# Patient Record
Sex: Female | Born: 1960 | Race: White | Hispanic: No | Smoking: Current every day smoker
Health system: Southern US, Community
[De-identification: ages and names within clinical notes are randomized; demographics above are authoritative.]

## PROBLEM LIST (undated history)

## (undated) DIAGNOSIS — E785 Hyperlipidemia, unspecified: Secondary | ICD-10-CM

## (undated) DIAGNOSIS — M722 Plantar fascial fibromatosis: Secondary | ICD-10-CM

## (undated) DIAGNOSIS — E538 Deficiency of other specified B group vitamins: Secondary | ICD-10-CM

## (undated) DIAGNOSIS — K2970 Gastritis, unspecified, without bleeding: Secondary | ICD-10-CM

## (undated) DIAGNOSIS — R3129 Other microscopic hematuria: Secondary | ICD-10-CM

## (undated) DIAGNOSIS — M1990 Unspecified osteoarthritis, unspecified site: Secondary | ICD-10-CM

## (undated) DIAGNOSIS — K2980 Duodenitis without bleeding: Secondary | ICD-10-CM

## (undated) DIAGNOSIS — I739 Peripheral vascular disease, unspecified: Secondary | ICD-10-CM

## (undated) DIAGNOSIS — F419 Anxiety disorder, unspecified: Secondary | ICD-10-CM

## (undated) DIAGNOSIS — F329 Major depressive disorder, single episode, unspecified: Secondary | ICD-10-CM

## (undated) DIAGNOSIS — F32A Depression, unspecified: Secondary | ICD-10-CM

## (undated) DIAGNOSIS — E079 Disorder of thyroid, unspecified: Secondary | ICD-10-CM

## (undated) DIAGNOSIS — K219 Gastro-esophageal reflux disease without esophagitis: Secondary | ICD-10-CM

## (undated) DIAGNOSIS — E039 Hypothyroidism, unspecified: Secondary | ICD-10-CM

## (undated) HISTORY — DX: Anxiety disorder, unspecified: F41.9

## (undated) HISTORY — DX: Major depressive disorder, single episode, unspecified: F32.9

## (undated) HISTORY — DX: Depression, unspecified: F32.A

---

## 2004-05-31 ENCOUNTER — Ambulatory Visit: Payer: Self-pay | Admitting: Internal Medicine

## 2005-05-16 ENCOUNTER — Inpatient Hospital Stay: Payer: Self-pay | Admitting: Internal Medicine

## 2005-05-17 ENCOUNTER — Other Ambulatory Visit: Payer: Self-pay

## 2006-11-28 ENCOUNTER — Ambulatory Visit: Payer: Self-pay | Admitting: Internal Medicine

## 2006-12-31 ENCOUNTER — Ambulatory Visit: Payer: Self-pay | Admitting: Internal Medicine

## 2007-01-07 ENCOUNTER — Ambulatory Visit: Payer: Self-pay | Admitting: Internal Medicine

## 2007-10-31 ENCOUNTER — Ambulatory Visit: Payer: Self-pay | Admitting: Internal Medicine

## 2011-01-22 ENCOUNTER — Emergency Department: Payer: Self-pay | Admitting: Emergency Medicine

## 2012-07-17 HISTORY — PX: COLONOSCOPY: SHX174

## 2012-07-18 ENCOUNTER — Ambulatory Visit: Payer: Self-pay | Admitting: Internal Medicine

## 2013-02-17 ENCOUNTER — Ambulatory Visit: Payer: Self-pay | Admitting: Unknown Physician Specialty

## 2015-08-12 ENCOUNTER — Other Ambulatory Visit: Payer: Self-pay | Admitting: Internal Medicine

## 2015-08-12 DIAGNOSIS — Z6841 Body Mass Index (BMI) 40.0 and over, adult: Secondary | ICD-10-CM | POA: Insufficient documentation

## 2015-08-12 DIAGNOSIS — R200 Anesthesia of skin: Secondary | ICD-10-CM

## 2015-08-12 DIAGNOSIS — N3944 Nocturnal enuresis: Secondary | ICD-10-CM

## 2015-08-24 ENCOUNTER — Ambulatory Visit: Admission: RE | Admit: 2015-08-24 | Payer: Self-pay | Source: Ambulatory Visit

## 2015-09-08 ENCOUNTER — Ambulatory Visit: Payer: BLUE CROSS/BLUE SHIELD

## 2016-10-04 ENCOUNTER — Other Ambulatory Visit: Payer: Self-pay | Admitting: Internal Medicine

## 2016-10-04 DIAGNOSIS — Z1231 Encounter for screening mammogram for malignant neoplasm of breast: Secondary | ICD-10-CM

## 2016-10-10 ENCOUNTER — Ambulatory Visit: Payer: BLUE CROSS/BLUE SHIELD

## 2016-10-23 ENCOUNTER — Ambulatory Visit: Payer: Self-pay | Attending: Oncology

## 2016-10-23 ENCOUNTER — Ambulatory Visit: Payer: BLUE CROSS/BLUE SHIELD

## 2017-09-20 ENCOUNTER — Encounter: Payer: Self-pay | Admitting: Emergency Medicine

## 2017-09-20 ENCOUNTER — Emergency Department: Payer: Medicaid Other

## 2017-09-20 ENCOUNTER — Emergency Department
Admission: EM | Admit: 2017-09-20 | Discharge: 2017-09-20 | Disposition: A | Discharge status: Home / Self Care | Payer: Medicaid Other | Attending: Emergency Medicine | Admitting: Emergency Medicine

## 2017-09-20 DIAGNOSIS — Y9389 Activity, other specified: Secondary | ICD-10-CM | POA: Diagnosis not present

## 2017-09-20 DIAGNOSIS — Y929 Unspecified place or not applicable: Secondary | ICD-10-CM | POA: Insufficient documentation

## 2017-09-20 DIAGNOSIS — Y999 Unspecified external cause status: Secondary | ICD-10-CM | POA: Insufficient documentation

## 2017-09-20 DIAGNOSIS — S61221A Laceration with foreign body of left index finger without damage to nail, initial encounter: Secondary | ICD-10-CM | POA: Insufficient documentation

## 2017-09-20 DIAGNOSIS — Z23 Encounter for immunization: Secondary | ICD-10-CM | POA: Insufficient documentation

## 2017-09-20 DIAGNOSIS — W268XXA Contact with other sharp object(s), not elsewhere classified, initial encounter: Secondary | ICD-10-CM | POA: Insufficient documentation

## 2017-09-20 DIAGNOSIS — S61211A Laceration without foreign body of left index finger without damage to nail, initial encounter: Secondary | ICD-10-CM

## 2017-09-20 MED ORDER — HYDROCODONE-ACETAMINOPHEN 5-325 MG PO TABS
1.0000 | ORAL_TABLET | Freq: Once | ORAL | Status: AC
  Administered 2017-09-20: 1 via ORAL
  Filled 2017-09-20: qty 1

## 2017-09-20 MED ORDER — LIDOCAINE HCL 1 % IJ SOLN
5.0000 mL | Freq: Once | INTRAMUSCULAR | Status: DC
  Filled 2017-09-20: qty 5

## 2017-09-20 MED ORDER — CEPHALEXIN 500 MG PO CAPS: 500.0000 mg | ORAL_CAPSULE | Freq: Three times a day (TID) | ORAL | 0 refills | Status: AC

## 2017-09-20 MED ORDER — LIDOCAINE HCL (PF) 1 % IJ SOLN
INTRAMUSCULAR | Status: AC
  Filled 2017-09-20: qty 5

## 2017-09-20 MED ORDER — HYDROCODONE-ACETAMINOPHEN 5-325 MG PO TABS
1.0000 | ORAL_TABLET | Freq: Four times a day (QID) | ORAL | 0 refills | Status: AC | PRN
Start: 2017-09-20 — End: 2017-09-23

## 2017-09-20 MED ORDER — TETANUS-DIPHTH-ACELL PERTUSSIS 5-2.5-18.5 LF-MCG/0.5 IM SUSP
0.5000 mL | Freq: Once | INTRAMUSCULAR | Status: AC
  Administered 2017-09-20: 0.5 mL via INTRAMUSCULAR
  Filled 2017-09-20: qty 0.5

## 2017-09-20 NOTE — ED Notes (Signed)
X-ray at bedside

## 2017-09-20 NOTE — ED Provider Notes (Signed)
[facility] Emergency Department Provider Note  ____________________________________________  Time seen: Approximately 10:37 PM  I have reviewed the triage vital signs and the nursing notes.   HISTORY  Chief Complaint Laceration    HPI Tamara Brady is a 57 y.o. female presents to the emergency department with a 2 cm semilunar left index finger laceration sustained as patient was trying to change her salt and pepper shakers.  Patient denies weakness, radiculopathy or changes in sensation of the upper extremities.  History reviewed. No pertinent past medical history.  There are no active problems to display for this patient.   History reviewed. No pertinent surgical history.  Prior to Admission medications   Medication Sig Start Date End Date Taking? Authorizing Provider  cephALEXin (KEFLEX) 500 MG capsule Take 1 capsule (500 mg total) by mouth 3 (three) times daily for 10 days. 09/20/17 09/30/17  Lannie Fields, PA-C  HYDROcodone-acetaminophen (NORCO) 5-325 MG tablet Take 1 tablet by mouth every 6 (six) hours as needed for up to 3 days for moderate pain. 09/20/17 09/23/17  Lannie Fields, PA-C    Allergies Patient has no known allergies.  No family history on file.  Social History Social History   Tobacco Use  . Smoking status: Never Smoker  . Smokeless tobacco: Never Used  Substance Use Topics  . Alcohol use: No    Frequency: Never  . Drug use: No     Review of Systems  Constitutional: No fever/chills Eyes: No visual changes. No discharge ENT: No upper respiratory complaints. Cardiovascular: no chest pain. Respiratory: no cough. No SOB. Gastrointestinal: No abdominal pain.  No nausea, no vomiting.  No diarrhea.  No constipation. Musculoskeletal: Negative for musculoskeletal pain. Skin: Patient has laceration. Neurological: Negative for headaches, focal weakness or  numbness.  ____________________________________________   PHYSICAL EXAM:  VITAL SIGNS: ED Triage Vitals  Enc Vitals Group     BP 09/20/17 2004 (!) 157/95     Pulse Rate 09/20/17 2004 80     Resp 09/20/17 2004 20     Temp 09/20/17 2004 98.1 F (36.7 C)     Temp Source 09/20/17 2004 Oral     SpO2 09/20/17 2004 97 %     Weight 09/20/17 2002 300 lb (136.1 kg)     Height 09/20/17 2002 5\' 5"  (1.651 m)     Head Circumference --      Peak Flow --      Pain Score 09/20/17 2002 7     Pain Loc --      Pain Edu? --      Excl. in [place]? --      Constitutional: Alert and oriented. Well appearing and in no acute distress. Eyes: Conjunctivae are normal. PERRL. EOMI. Head: Atraumatic. Cardiovascular: Normal rate, regular rhythm. Normal S1 and S2.  Good peripheral circulation. Respiratory: Normal respiratory effort without tachypnea or retractions. Lungs CTAB. Good air entry to the bases with no decreased or absent breath sounds. Musculoskeletal: Full range of motion to all extremities. No gross deformities appreciated.  Patient has 2 cm semilunar left index finger laceration along the dorsal aspect of the proximal digit.  Palpable radial pulse, left. Neurologic:  Normal speech and language. No gross focal neurologic deficits are appreciated.  Skin: Patient has 2 cm, semilunar left index finger laceration. Psychiatric: Mood and affect are normal. Speech and behavior are normal. Patient exhibits appropriate insight and judgement.   ____________________________________________   LABS (all labs ordered are listed, but only abnormal  results are displayed)  Labs Reviewed - No data to display ____________________________________________  EKG   ____________________________________________  RADIOLOGY Unk Pinto, personally viewed and evaluated these images (plain radiographs) as part of my medical decision making, as well as reviewing the written report by the radiologist.  Dg Hand  Complete Left  Result Date: 09/20/2017 CLINICAL DATA:  Laceration to the index finger. EXAM: LEFT HAND - COMPLETE 3+ VIEW COMPARISON:  None. FINDINGS: Normal anatomic alignment. No evidence for acute fracture or dislocation. Regional soft tissues are unremarkable. IMPRESSION: No acute osseous abnormality. Electronically Signed   By: Lovey Newcomer M.D.   On: 09/20/2017 21:12    ____________________________________________    PROCEDURES  Procedure(s) performed:    Procedures  LACERATION REPAIR Performed by: Lannie Fields Authorized by: Lannie Fields Consent: Verbal consent obtained. Risks and benefits: risks, benefits and alternatives were discussed Consent given by: patient Patient identity confirmed: provided demographic data Prepped and Draped in normal sterile fashion Wound explored  Laceration Location: Left index finger  Laceration Length: 2 cm   No Foreign Bodies seen or palpated  Anesthesia: local infiltration  Local anesthetic: lidocaine 1% without epinephrine  Anesthetic total: 5 ml  Irrigation method: syringe Amount of cleaning: standard  Skin closure: 4-0 Ethilon Number of sutures: 6  Technique: Simple interrupted  Patient tolerance: Patient tolerated the procedure well with no immediate complications.   Medications  lidocaine (XYLOCAINE) 1 % (with pres) injection 5 mL (not administered)  Tdap (BOOSTRIX) injection 0.5 mL (0.5 mLs Intramuscular Given 09/20/17 2142)  HYDROcodone-acetaminophen (NORCO/VICODIN) 5-325 MG per tablet 1 tablet (1 tablet Oral Given 09/20/17 2141)     ____________________________________________   INITIAL IMPRESSION / ASSESSMENT AND PLAN / ED COURSE  Pertinent labs & imaging results that were available during my care of the patient were reviewed by me and considered in my medical decision making (see chart for details).  Review of the [place] CSRS was performed in accordance of the [place] prior to dispensing any controlled drugs.      Assessment and plan Left index finger laceration Patient presents to the emergency department with a 2 cm semilunar laceration that was repaired in the emergency department without complication.  Patient was advised to have sutures removed by primary care in 9 days.  Patient was discharged with Keflex and her tetanus status was updated in the emergency department.  She was given Norco for pain and she was discharged with a short course of Norco.  All patient questions were answered.     ____________________________________________  FINAL CLINICAL IMPRESSION(S) / ED DIAGNOSES  Final diagnoses:  Laceration of left index finger without foreign body without damage to nail, initial encounter      NEW MEDICATIONS STARTED DURING THIS VISIT:  ED Discharge Orders        Ordered    cephALEXin (KEFLEX) 500 MG capsule  3 times daily     09/20/17 2135    HYDROcodone-acetaminophen (NORCO) 5-325 MG tablet  Every 6 hours PRN     09/20/17 2136          This chart was dictated using voice recognition software/Dragon. Despite best efforts to proofread, errors can occur which can change the meaning. Any change was purely unintentional.    Lannie Fields, PA-C 09/20/17 2241    Harvest Dark, MD 09/20/17 2242

## 2017-09-20 NOTE — ED Triage Notes (Signed)
Pt comes into the ED via POV c/o laceration to the left hand index finger.  Patient states she cut the finger on a knife.  Bleeding under control at this time and patient is in NAD.

## 2017-09-29 ENCOUNTER — Emergency Department
Admission: EM | Admit: 2017-09-29 | Discharge: 2017-09-29 | Disposition: A | Discharge status: Home / Self Care | Payer: Medicaid Other | Attending: Emergency Medicine | Admitting: Emergency Medicine

## 2017-09-29 ENCOUNTER — Other Ambulatory Visit: Payer: Self-pay

## 2017-09-29 ENCOUNTER — Encounter: Payer: Self-pay | Admitting: Emergency Medicine

## 2017-09-29 DIAGNOSIS — S61211D Laceration without foreign body of left index finger without damage to nail, subsequent encounter: Secondary | ICD-10-CM | POA: Insufficient documentation

## 2017-09-29 DIAGNOSIS — X58XXXA Exposure to other specified factors, initial encounter: Secondary | ICD-10-CM | POA: Diagnosis not present

## 2017-09-29 DIAGNOSIS — Z4802 Encounter for removal of sutures: Secondary | ICD-10-CM

## 2017-09-29 DIAGNOSIS — F17210 Nicotine dependence, cigarettes, uncomplicated: Secondary | ICD-10-CM | POA: Insufficient documentation

## 2017-09-29 DIAGNOSIS — S66902D Unspecified injury of unspecified muscle, fascia and tendon at wrist and hand level, left hand, subsequent encounter: Secondary | ICD-10-CM

## 2017-09-29 HISTORY — DX: Disorder of thyroid, unspecified: E07.9

## 2017-09-29 MED ORDER — FLUCONAZOLE 100 MG PO TABS: 150.0000 mg | ORAL_TABLET | Freq: Every day | ORAL | 0 refills | Status: AC

## 2017-09-29 NOTE — ED Triage Notes (Signed)
Here for suture removal L index finger, placed 9 days ago.

## 2017-09-29 NOTE — ED Notes (Signed)
Pt is here for suture removal

## 2017-09-29 NOTE — ED Provider Notes (Signed)
[facility] Emergency Department Provider Note  ____________________________________________  Time seen: Approximately 11:28 AM  I have reviewed the triage vital signs and the nursing notes.   HISTORY  Chief Complaint Suture / Staple Removal    HPI Tamara Brady is a 57 y.o. female that presents to the emergency department for suture removal.  She is having some difficulty bending finger without assistance from other hand.  Patient has been taking antibiotics as prescribed but states she forgot to ask for a prophylactic Diflucan for yeast infection when she was here last.  She usually gets a yeast infection with antibiotics. No drainage, numbness, tingling.   Past Medical History:  Diagnosis Date  . Thyroid disease     There are no active problems to display for this patient.   History reviewed. No pertinent surgical history.  Prior to Admission medications   Medication Sig Start Date End Date Taking? Authorizing Provider  cephALEXin (KEFLEX) 500 MG capsule Take 1 capsule (500 mg total) by mouth 3 (three) times daily for 10 days. 09/20/17 09/30/17  Lannie Fields, PA-C  fluconazole (DIFLUCAN) 100 MG tablet Take 1.5 tablets (150 mg total) by mouth daily. 09/29/17 10/29/17  Laban Emperor, PA-C    Allergies Patient has no known allergies.  No family history on file.  Social History Social History   Tobacco Use  . Smoking status: Current Every Day Smoker    Packs/day: 0.50    Types: Cigarettes  . Smokeless tobacco: Never Used  Substance Use Topics  . Alcohol use: No    Frequency: Never  . Drug use: No     Review of Systems  Constitutional: No fever/chills Musculoskeletal: Negative for musculoskeletal pain. Skin: Negative for rash, ecchymosis. Neurological: Negative for numbness or tingling   ____________________________________________   PHYSICAL EXAM:  VITAL SIGNS: ED Triage Vitals  Enc Vitals Group     BP 09/29/17 0929  133/90     Pulse Rate 09/29/17 0929 95     Resp 09/29/17 0929 20     Temp 09/29/17 0929 98.1 F (36.7 C)     Temp Source 09/29/17 0929 Oral     SpO2 09/29/17 0929 96 %     Weight 09/29/17 0930 300 lb (136.1 kg)     Height 09/29/17 0930 5\' 5"  (1.651 m)     Head Circumference --      Peak Flow --      Pain Score 09/29/17 0929 0     Pain Loc --      Pain Edu? --      Excl. in [place]? --      Constitutional: Alert and oriented. Well appearing and in no acute distress. Eyes: Conjunctivae are normal. PERRL. EOMI. Head: Atraumatic. ENT:      Ears:      Nose: No congestion/rhinnorhea.      Mouth/Throat: Mucous membranes are moist.  Neck: No stridor.   Cardiovascular:  Good peripheral circulation. Respiratory: Normal respiratory effort without tachypnea or retractions.  Musculoskeletal: Full range of motion to all extremities. No gross deformities appreciated.  Difficulty actively flexing left index finger at PIP joint.  No pain with passive flexion at PIP joint of left index finger.  Neurologic:  Normal speech and language. No gross focal neurologic deficits are appreciated.  Skin:  Skin is warm, dry. 2cm semilunar well healing laceration to left index finger with 9 sutures in place. No drainage. No surrounding erythema.    ____________________________________________   LABS (all labs  ordered are listed, but only abnormal results are displayed)  Labs Reviewed - No data to display ____________________________________________  EKG   ____________________________________________  RADIOLOGY   No results found.  ____________________________________________    PROCEDURES  Procedure(s) performed:    Procedures  SUTURE REMOVAL Performed by: Laban Emperor  Consent: Verbal consent obtained. Patient identity confirmed: provided demographic data Time out: Immediately prior to procedure a "time out" was called to verify the correct patient, procedure, equipment, support  staff and site/side marked as required.  Location details: finger  Wound Appearance: clean  Sutures/Staples Removed: 9  Facility: sutures placed in this facility Patient tolerance: Patient tolerated the procedure well with no immediate complications.    Medications - No data to display   ____________________________________________   INITIAL IMPRESSION / ASSESSMENT AND PLAN / ED COURSE  Pertinent labs & imaging results that were available during my care of the patient were reviewed by me and considered in my medical decision making (see chart for details).  Review of the [place] CSRS was performed in accordance of the [place] prior to dispensing any controlled drugs.   Patient presented to the emergency department for suture removal.  Vital signs and exam are reassuring.  Sutures were removed in ED.  Steri-Strips were placed.  Splint was placed.  Patient is having difficulty actively bending finger at PIP joint of left index finger so she will be referred to orthopedics.  She is requesting a prescription for Diflucan for frequent yeast infections that she gets with antibiotics.  Patient will be discharged home with prescriptions for diflucan. Patient is to follow up with orthopedics as directed. Patient is given ED precautions to return to the ED for any worsening or new symptoms.     ____________________________________________  FINAL CLINICAL IMPRESSION(S) / ED DIAGNOSES  Final diagnoses:  Injury of extensor tendon of left hand, subsequent encounter  Visit for suture removal      NEW MEDICATIONS STARTED DURING THIS VISIT:  ED Discharge Orders        Ordered    fluconazole (DIFLUCAN) 100 MG tablet  Daily     09/29/17 1034          This chart was dictated using voice recognition software/Dragon. Despite best efforts to proofread, errors can occur which can change the meaning. Any change was purely unintentional.    Laban Emperor, PA-C 09/29/17 1538    Lavonia Drafts, MD 09/30/17 [phone]

## 2017-12-17 ENCOUNTER — Other Ambulatory Visit
Admission: RE | Admit: 2017-12-17 | Discharge: 2017-12-17 | Disposition: A | Discharge status: Home / Self Care | Payer: Medicaid Other | Source: Ambulatory Visit | Attending: Internal Medicine | Admitting: Internal Medicine

## 2017-12-17 DIAGNOSIS — R0609 Other forms of dyspnea: Secondary | ICD-10-CM | POA: Diagnosis not present

## 2017-12-17 DIAGNOSIS — E034 Atrophy of thyroid (acquired): Secondary | ICD-10-CM | POA: Insufficient documentation

## 2017-12-17 LAB — FIBRIN DERIVATIVES D-DIMER (ARMC ONLY): FIBRIN DERIVATIVES D-DIMER (ARMC): 418.61 ng{FEU}/mL (ref 0.00–499.00)

## 2018-04-05 ENCOUNTER — Other Ambulatory Visit: Payer: Self-pay | Admitting: Orthopedic Surgery

## 2018-04-05 DIAGNOSIS — M5442 Lumbago with sciatica, left side: Secondary | ICD-10-CM

## 2018-04-08 ENCOUNTER — Ambulatory Visit (INDEPENDENT_AMBULATORY_CARE_PROVIDER_SITE_OTHER): Payer: Medicaid Other | Admitting: Psychiatry

## 2018-04-08 ENCOUNTER — Encounter: Payer: Self-pay | Admitting: Psychiatry

## 2018-04-08 ENCOUNTER — Other Ambulatory Visit: Payer: Self-pay

## 2018-04-08 VITALS — BP 126/72 | HR 85 | Temp 97.6°F | Wt 293.6 lb

## 2018-04-08 DIAGNOSIS — E538 Deficiency of other specified B group vitamins: Secondary | ICD-10-CM | POA: Insufficient documentation

## 2018-04-08 DIAGNOSIS — Z634 Disappearance and death of family member: Secondary | ICD-10-CM

## 2018-04-08 DIAGNOSIS — F411 Generalized anxiety disorder: Secondary | ICD-10-CM | POA: Diagnosis not present

## 2018-04-08 DIAGNOSIS — F331 Major depressive disorder, recurrent, moderate: Secondary | ICD-10-CM

## 2018-04-08 DIAGNOSIS — F32A Depression, unspecified: Secondary | ICD-10-CM | POA: Insufficient documentation

## 2018-04-08 DIAGNOSIS — E039 Hypothyroidism, unspecified: Secondary | ICD-10-CM | POA: Insufficient documentation

## 2018-04-08 DIAGNOSIS — F5105 Insomnia due to other mental disorder: Secondary | ICD-10-CM | POA: Diagnosis not present

## 2018-04-08 DIAGNOSIS — F329 Major depressive disorder, single episode, unspecified: Secondary | ICD-10-CM | POA: Insufficient documentation

## 2018-04-08 DIAGNOSIS — I739 Peripheral vascular disease, unspecified: Secondary | ICD-10-CM | POA: Insufficient documentation

## 2018-04-08 DIAGNOSIS — E785 Hyperlipidemia, unspecified: Secondary | ICD-10-CM | POA: Insufficient documentation

## 2018-04-08 DIAGNOSIS — R3129 Other microscopic hematuria: Secondary | ICD-10-CM | POA: Insufficient documentation

## 2018-04-08 DIAGNOSIS — K219 Gastro-esophageal reflux disease without esophagitis: Secondary | ICD-10-CM | POA: Insufficient documentation

## 2018-04-08 DIAGNOSIS — M1990 Unspecified osteoarthritis, unspecified site: Secondary | ICD-10-CM | POA: Insufficient documentation

## 2018-04-08 MED ORDER — QUETIAPINE FUMARATE 50 MG PO TABS: 25.0000 mg | ORAL_TABLET | Freq: Every day | ORAL | 0 refills | Status: DC

## 2018-04-08 MED ORDER — VENLAFAXINE HCL ER 37.5 MG PO CP24: 37.5000 mg | ORAL_CAPSULE | Freq: Every day | ORAL | 0 refills | Status: DC

## 2018-04-08 MED ORDER — TRAZODONE HCL 50 MG PO TABS: 25.0000 mg | ORAL_TABLET | Freq: Every day | ORAL | 0 refills | Status: DC

## 2018-04-08 NOTE — Patient Instructions (Signed)
Trazodone tablets What is this medicine? TRAZODONE (TRAZ oh done) is used to treat depression. This medicine may be used for other purposes; ask your health care provider or pharmacist if you have questions. COMMON BRAND NAME(S): Desyrel What should I tell my health care provider before I take this medicine? They need to know if you have any of these conditions: -attempted suicide or thinking about it -bipolar disorder -bleeding problems -glaucoma -heart disease, or previous heart attack -irregular heart beat -kidney or liver disease -low levels of sodium in the blood -an unusual or allergic reaction to trazodone, other medicines, foods, dyes or preservatives -pregnant or trying to get pregnant -breast-feeding How should I use this medicine? Take this medicine by mouth with a glass of water. Follow the directions on the prescription label. Take this medicine shortly after a meal or a light snack. Take your medicine at regular intervals. Do not take your medicine more often than directed. Do not stop taking this medicine suddenly except upon the advice of your doctor. Stopping this medicine too quickly may cause serious side effects or your condition may worsen. A special MedGuide will be given to you by the pharmacist with each prescription and refill. Be sure to read this information carefully each time. Talk to your pediatrician regarding the use of this medicine in children. Special care may be needed. Overdosage: If you think you have taken too much of this medicine contact a poison control center or emergency room at once. NOTE: This medicine is only for you. Do not share this medicine with others. What if I miss a dose? If you miss a dose, take it as soon as you can. If it is almost time for your next dose, take only that dose. Do not take double or extra doses. What may interact with this medicine? Do not take this medicine with any of the following medications: -certain medicines  for fungal infections like fluconazole, itraconazole, ketoconazole, posaconazole, voriconazole -cisapride -dofetilide -dronedarone -linezolid -MAOIs like Carbex, Eldepryl, Marplan, Nardil, and Parnate -mesoridazine -methylene blue (injected into a vein) -pimozide -saquinavir -thioridazine -ziprasidone This medicine may also interact with the following medications: -alcohol -antiviral medicines for HIV or AIDS -aspirin and aspirin-like medicines -barbiturates like phenobarbital -certain medicines for blood pressure, heart disease, irregular heart beat -certain medicines for depression, anxiety, or psychotic disturbances -certain medicines for migraine headache like almotriptan, eletriptan, frovatriptan, naratriptan, rizatriptan, sumatriptan, zolmitriptan -certain medicines for seizures like carbamazepine and phenytoin -certain medicines for sleep -certain medicines that treat or prevent blood clots like dalteparin, enoxaparin, warfarin -digoxin -fentanyl -lithium -NSAIDS, medicines for pain and inflammation, like ibuprofen or naproxen -other medicines that prolong the QT interval (cause an abnormal heart rhythm) -rasagiline -supplements like St. John's wort, kava kava, valerian -tramadol -tryptophan This list may not describe all possible interactions. Give your health care provider a list of all the medicines, herbs, non-prescription drugs, or dietary supplements you use. Also tell them if you smoke, drink alcohol, or use illegal drugs. Some items may interact with your medicine. What should I watch for while using this medicine? Tell your doctor if your symptoms do not get better or if they get worse. Visit your doctor or health care professional for regular checks on your progress. Because it may take several weeks to see the full effects of this medicine, it is important to continue your treatment as prescribed by your doctor. Patients and their families should watch out for new  or worsening thoughts of suicide or depression. Also   watch out for sudden changes in feelings such as feeling anxious, agitated, panicky, irritable, hostile, aggressive, impulsive, severely restless, overly excited and hyperactive, or not being able to sleep. If this happens, especially at the beginning of treatment or after a change in dose, call your health care professional. Dennis Bast may get drowsy or dizzy. Do not drive, use machinery, or do anything that needs mental alertness until you know how this medicine affects you. Do not stand or sit up quickly, especially if you are an older patient. This reduces the risk of dizzy or fainting spells. Alcohol may interfere with the effect of this medicine. Avoid alcoholic drinks. This medicine may cause dry eyes and blurred vision. If you wear contact lenses you may feel some discomfort. Lubricating drops may help. See your eye doctor if the problem does not go away or is severe. Your mouth may get dry. Chewing sugarless gum, sucking hard candy and drinking plenty of water may help. Contact your doctor if the problem does not go away or is severe. What side effects may I notice from receiving this medicine? Side effects that you should report to your doctor or health care professional as soon as possible: -allergic reactions like skin rash, itching or hives, swelling of the face, lips, or tongue -elevated mood, decreased need for sleep, racing thoughts, impulsive behavior -confusion -fast, irregular heartbeat -feeling faint or lightheaded, falls -feeling agitated, angry, or irritable -loss of balance or coordination -painful or prolonged erections -restlessness, pacing, inability to keep still -suicidal thoughts or other mood changes -tremors -trouble sleeping -seizures -unusual bleeding or bruising Side effects that usually do not require medical attention (report to your doctor or health care professional if they continue or are bothersome): -change in  sex drive or performance -change in appetite or weight -constipation -headache -muscle aches or pains -nausea This list may not describe all possible side effects. Call your doctor for medical advice about side effects. You may report side effects to FDA at 1-800-FDA-1088. Where should I keep my medicine? Keep out of the reach of children. Store at room temperature between 15 and 30 degrees C (59 to 86 degrees F). Protect from light. Keep container tightly closed. Throw away any unused medicine after the expiration date. NOTE: This sheet is a summary. It may not cover all possible information. If you have questions about this medicine, talk to your doctor, pharmacist, or health care provider.  2018 [employer] (2015-12-02 16:57:05) Venlafaxine extended-release capsules What is this medicine? VENLAFAXINE(VEN la fax een) is used to treat depression, anxiety and panic disorder. This medicine may be used for other purposes; ask your health care provider or pharmacist if you have questions. COMMON BRAND NAME(S): Effexor XR What should I tell my health care provider before I take this medicine? They need to know if you have any of these conditions: -bleeding disorders -glaucoma -heart disease -high blood pressure -high cholesterol -kidney disease -liver disease -low levels of sodium in the blood -mania or bipolar disorder -seizures -suicidal thoughts, plans, or attempt; a previous suicide attempt by you or a family -take medicines that treat or prevent blood clots -thyroid disease -an unusual or allergic reaction to venlafaxine, desvenlafaxine, other medicines, foods, dyes, or preservatives -pregnant or trying to get pregnant -breast-feeding How should I use this medicine? Take this medicine by mouth with a full glass of water. Follow the directions on the prescription label. Do not cut, crush, or chew this medicine. Take it with food. If needed, the capsule  may be carefully  opened and the entire contents sprinkled on a spoonful of cool applesauce. Swallow the applesauce/pellet mixture right away without chewing and follow with a glass of water to ensure complete swallowing of the pellets. Try to take your medicine at about the same time each day. Do not take your medicine more often than directed. Do not stop taking this medicine suddenly except upon the advice of your doctor. Stopping this medicine too quickly may cause serious side effects or your condition may worsen. A special MedGuide will be given to you by the pharmacist with each prescription and refill. Be sure to read this information carefully each time. Talk to your pediatrician regarding the use of this medicine in children. Special care may be needed. Overdosage: If you think you have taken too much of this medicine contact a poison control center or emergency room at once. NOTE: This medicine is only for you. Do not share this medicine with others. What if I miss a dose? If you miss a dose, take it as soon as you can. If it is almost time for your next dose, take only that dose. Do not take double or extra doses. What may interact with this medicine? Do not take this medicine with any of the following medications: -certain medicines for fungal infections like fluconazole, itraconazole, ketoconazole, posaconazole, voriconazole -cisapride -desvenlafaxine -dofetilide -dronedarone -duloxetine -levomilnacipran -linezolid -MAOIs like Carbex, Eldepryl, Marplan, Nardil, and Parnate -methylene blue (injected into a vein) -milnacipran -pimozide -thioridazine -ziprasidone This medicine may also interact with the following medications: -amphetamines -aspirin and aspirin-like medicines -certain medicines for depression, anxiety, or psychotic disturbances -certain medicines for migraine headaches like almotriptan, eletriptan, frovatriptan, naratriptan, rizatriptan, sumatriptan, zolmitriptan -certain  medicines for sleep -certain medicines that treat or prevent blood clots like dalteparin, enoxaparin, warfarin -cimetidine -clozapine -diuretics -fentanyl -furazolidone -indinavir -isoniazid -lithium -metoprolol -NSAIDS, medicines for pain and inflammation, like ibuprofen or naproxen -other medicines that prolong the QT interval (cause an abnormal heart rhythm) -procarbazine -rasagiline -supplements like St. John's wort, kava kava, valerian -tramadol -tryptophan This list may not describe all possible interactions. Give your health care provider a list of all the medicines, herbs, non-prescription drugs, or dietary supplements you use. Also tell them if you smoke, drink alcohol, or use illegal drugs. Some items may interact with your medicine. What should I watch for while using this medicine? Tell your doctor if your symptoms do not get better or if they get worse. Visit your doctor or health care professional for regular checks on your progress. Because it may take several weeks to see the full effects of this medicine, it is important to continue your treatment as prescribed by your doctor. Patients and their families should watch out for new or worsening thoughts of suicide or depression. Also watch out for sudden changes in feelings such as feeling anxious, agitated, panicky, irritable, hostile, aggressive, impulsive, severely restless, overly excited and hyperactive, or not being able to sleep. If this happens, especially at the beginning of treatment or after a change in dose, call your health care professional. This medicine can cause an increase in blood pressure. Check with your doctor for instructions on monitoring your blood pressure while taking this medicine. You may get drowsy or dizzy. Do not drive, use machinery, or do anything that needs mental alertness until you know how this medicine affects you. Do not stand or sit up quickly, especially if you are an older patient. This  reduces the risk of dizzy or fainting  spells. Alcohol may interfere with the effect of this medicine. Avoid alcoholic drinks. Your mouth may get dry. Chewing sugarless gum, sucking hard candy and drinking plenty of water will help. Contact your doctor if the problem does not go away or is severe. What side effects may I notice from receiving this medicine? Side effects that you should report to your doctor or health care professional as soon as possible: -allergic reactions like skin rash, itching or hives, swelling of the face, lips, or tongue -anxious -breathing problems -confusion -changes in vision -chest pain -confusion -elevated mood, decreased need for sleep, racing thoughts, impulsive behavior -eye pain -fast, irregular heartbeat -feeling faint or lightheaded, falls -feeling agitated, angry, or irritable -hallucination, loss of contact with reality -high blood pressure -loss of balance or coordination -palpitations -redness, blistering, peeling or loosening of the skin, including inside the mouth -restlessness, pacing, inability to keep still -seizures -stiff muscles -suicidal thoughts or other mood changes -trouble passing urine or change in the amount of urine -trouble sleeping -unusual bleeding or bruising -unusually weak or tired -vomiting Side effects that usually do not require medical attention (report to your doctor or health care professional if they continue or are bothersome): -change in sex drive or performance -change in appetite or weight -constipation -dizziness -dry mouth -headache -increased sweating -nausea -tired This list may not describe all possible side effects. Call your doctor for medical advice about side effects. You may report side effects to FDA at 1-800-FDA-1088. Where should I keep my medicine? Keep out of the reach of children. Store at a controlled temperature between 20 and 25 degrees C (68 degrees and 77 degrees F), in a dry place.  Throw away any unused medicine after the expiration date. NOTE: This sheet is a summary. It may not cover all possible information. If you have questions about this medicine, talk to your doctor, pharmacist, or health care provider.  2018 [employer] Standard (2015-12-02 18:38:02)
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
## 2018-04-10 ENCOUNTER — Ambulatory Visit: Payer: Medicaid Other | Attending: Internal Medicine

## 2018-04-10 DIAGNOSIS — G4761 Periodic limb movement disorder: Secondary | ICD-10-CM | POA: Diagnosis not present

## 2018-04-10 DIAGNOSIS — G4733 Obstructive sleep apnea (adult) (pediatric): Secondary | ICD-10-CM | POA: Diagnosis present

## 2018-04-16 ENCOUNTER — Ambulatory Visit (INDEPENDENT_AMBULATORY_CARE_PROVIDER_SITE_OTHER): Payer: Medicaid Other | Admitting: Licensed Clinical Social Worker

## 2018-04-16 DIAGNOSIS — F331 Major depressive disorder, recurrent, moderate: Secondary | ICD-10-CM

## 2018-04-16 DIAGNOSIS — F411 Generalized anxiety disorder: Secondary | ICD-10-CM
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
## 2018-04-18 DIAGNOSIS — G4733 Obstructive sleep apnea (adult) (pediatric): Secondary | ICD-10-CM | POA: Insufficient documentation

## 2018-04-19 ENCOUNTER — Ambulatory Visit
Admission: RE | Admit: 2018-04-19 | Discharge: 2018-04-19 | Disposition: A | Discharge status: Home / Self Care | Payer: Medicaid Other | Source: Ambulatory Visit | Attending: Orthopedic Surgery | Admitting: Orthopedic Surgery

## 2018-04-19 DIAGNOSIS — M5136 Other intervertebral disc degeneration, lumbar region: Secondary | ICD-10-CM | POA: Insufficient documentation

## 2018-04-19 DIAGNOSIS — M5442 Lumbago with sciatica, left side: Secondary | ICD-10-CM

## 2018-04-22 ENCOUNTER — Ambulatory Visit (INDEPENDENT_AMBULATORY_CARE_PROVIDER_SITE_OTHER): Payer: Medicaid Other | Admitting: Psychiatry

## 2018-04-22 ENCOUNTER — Other Ambulatory Visit: Payer: Self-pay

## 2018-04-22 ENCOUNTER — Encounter: Payer: Self-pay | Admitting: Psychiatry

## 2018-04-22 VITALS — BP 110/60 | Temp 98.2°F | Ht 65.0 in | Wt 292.8 lb

## 2018-04-22 DIAGNOSIS — F331 Major depressive disorder, recurrent, moderate: Secondary | ICD-10-CM

## 2018-04-22 DIAGNOSIS — F5105 Insomnia due to other mental disorder: Secondary | ICD-10-CM

## 2018-04-22 DIAGNOSIS — Z634 Disappearance and death of family member: Secondary | ICD-10-CM | POA: Diagnosis not present

## 2018-04-22 DIAGNOSIS — F411 Generalized anxiety disorder: Secondary | ICD-10-CM | POA: Diagnosis not present

## 2018-04-22 MED ORDER — QUETIAPINE FUMARATE 25 MG PO TABS: 75.0000 mg | ORAL_TABLET | Freq: Every day | ORAL | 1 refills | Status: DC

## 2018-04-22 MED ORDER — VENLAFAXINE HCL ER 75 MG PO CP24: 75.0000 mg | ORAL_CAPSULE | Freq: Every day | ORAL | 1 refills | Status: DC

## 2018-04-22 NOTE — Progress Notes (Signed)
Tamara Brady  04/22/2018 3:13 PM Tamara Brady  MRN:  [id number]  Chief Complaint: ' I am here for follow up.' Chief Complaint    Follow-up; Medication Refill     HPI: Tamara Brady is a 57 year old Caucasian female who is widowed, lives in [place], unemployed, has a history of depression, anxiety, insomnia, bereavement, hypothyroidism, vitamin B12 deficiency hyperlipidemia, GERD, arthritis, UC, presented to clinic today for a follow-up visit.  Patient continues to struggle with depression and anxiety symptoms.  She reports she is tolerating the Effexor and the Seroquel well.  She continues to struggle with sleep problems.  She reports she continues to wake up a couple of times at night.  She also reports sadness, crying spells, reduced appetite and so on on a regular basis.  She however reports she is hanging onto her faith and trying to cope with her loss.  She reports she deals with a lot of guilt on a regular basis about what she could have done differently for her husband who passed away recently.  Patient has been seeing Ms. Royal Piedra for counseling/CBT.  Patient will continue to follow-up with her on a regular basis.  Patient continues to struggle with medical problems like vitamin B12 deficiency as well as ulcerative colitis.  Discussed with patient to reach out to her primary medical doctor for management.  Discussed readjusting her dosage of medication. Visit Diagnosis:    ICD-10-CM   1. GAD (generalized anxiety disorder) F41.1 venlafaxine XR (EFFEXOR XR) 75 MG 24 hr capsule  2. MDD (major depressive disorder), recurrent episode, moderate (HCC) F33.1 venlafaxine XR (EFFEXOR XR) 75 MG 24 hr capsule    QUEtiapine (SEROQUEL) 25 MG tablet  3. Insomnia due to mental condition F51.05 venlafaxine XR (EFFEXOR XR) 75 MG 24 hr capsule    QUEtiapine (SEROQUEL) 25 MG tablet  4. Bereavement Z63.4     Past Psychiatric History: Reviewed past psychiatric history from my progress  Brady on 04/08/2018.  Past trials of Zoloft-constipation, Paxil, Wellbutrin, Seroquel, trazodone  Past Medical History:  Past Medical History:  Diagnosis Date  . Anxiety   . Depression   . Thyroid disease    History reviewed. No pertinent surgical history.  Family Psychiatric History: Reviewed family psychiatric history from my progress Brady on 04/08/2018  Family History:  Family History  Problem Relation Age of Onset  . Anxiety disorder Brother   . Depression Brother     Social History: Reviewed social history from my progress Brady on 04/08/2018 Social History   Socioeconomic History  . Marital status: Widowed    Spouse name: Not on file  . Number of children: 2  . Years of education: Not on file  . Highest education level: GED or equivalent  Occupational History  . Not on file  Social Needs  . Financial resource strain: Very hard  . Food insecurity:    Worry: Often true    Inability: Often true  . Transportation needs:    Medical: No    Non-medical: No  Tobacco Use  . Smoking status: Current Every Day Smoker    Packs/day: 0.50    Types: Cigarettes  . Smokeless tobacco: Never Used  Substance and Sexual Activity  . Alcohol use: Not Currently    Frequency: Never  . Drug use: No  . Sexual activity: Not Currently  Lifestyle  . Physical activity:    Days per week: 0 days    Minutes per session: 0 min  . Stress: Very  much  Relationships  . Social connections:    Talks on phone: Not on file    Gets together: Not on file    Attends religious service: More than 4 times per year    Active member of club or organization: No    Attends meetings of clubs or organizations: Never    Relationship status: Widowed  Other Topics Concern  . Not on file  Social History Narrative  . Not on file    Allergies:  Allergies  Allergen Reactions  . Azelastine Nausea Only    Metabolic Disorder Labs: No results found for: HGBA1C, MPG No results found for: PROLACTIN No  results found for: CHOL, TRIG, HDL, CHOLHDL, VLDL, LDLCALC No results found for: TSH  Therapeutic Level Labs: No results found for: LITHIUM No results found for: VALPROATE No components found for:  CBMZ  Current Medications: Current Outpatient Medications  Medication Sig Dispense Refill  . albuterol (PROVENTIL HFA;VENTOLIN HFA) 108 (90 Base) MCG/ACT inhaler Inhale into the lungs.    Marland Kitchen aspirin EC 81 MG tablet Take by mouth.    . levothyroxine (SYNTHROID, LEVOTHROID) 137 MCG tablet TAKE 1 TABLET BY MOUTH EVERY DAY ON AN EMPTY STOMACH AT LEAST 30-60 MINS BEFORE BREAKFAST    . magnesium oxide (MAG-OX) 400 MG tablet Take by mouth.    . traZODone (DESYREL) 50 MG tablet Take 0.5-1 tablets (25-50 mg total) by mouth at bedtime. 90 tablet 0  . QUEtiapine (SEROQUEL) 25 MG tablet Take 3 tablets (75 mg total) by mouth at bedtime. Mood and sleep 90 tablet 1  . venlafaxine XR (EFFEXOR XR) 75 MG 24 hr capsule Take 1 capsule (75 mg total) by mouth daily with breakfast. For mood 30 capsule 1   No current facility-administered medications for this visit.      Musculoskeletal: Strength & Muscle Tone: within normal limits Gait & Station: normal Patient leans: N/A  Psychiatric Specialty Exam: Review of Systems  Psychiatric/Behavioral: Positive for depression. The patient is nervous/anxious and has insomnia.   All other systems reviewed and are negative.   Blood pressure 110/60, temperature 98.2 F (36.8 C), temperature source Tympanic, height 5\' 5"  (1.651 m), weight 292 lb 12.8 oz (132.8 kg).Body mass index is 48.72 kg/m.  General Appearance: Casual  Eye Contact:  Fair  Speech:  Clear and Coherent  Volume:  Normal  Mood:  Anxious and Dysphoric  Affect:  Congruent  Thought Process:  Goal Directed and Descriptions of Associations: Intact  Orientation:  Full (Time, Place, and Person)  Thought Content: Logical   Suicidal Thoughts:  No  Homicidal Thoughts:  No  Memory:  Immediate;   Fair Recent;    Fair Remote;   Fair  Judgement:  Fair  Insight:  Fair  Psychomotor Activity:  Normal  Concentration:  Concentration: Fair and Attention Span: Fair  Recall:  [employer] of Knowledge: Fair  Language: Fair  Akathisia:  No  Handed:  Right  AIMS (if indicated): 0  Assets:  Communication Skills Desire for Improvement Social Support  ADL's:  Intact  Cognition: WNL  Sleep:  restless   Screenings: AIMS     Office Visit from 04/08/2018 in [place] Total Score  0       Assessment and Plan: Kaiya is a 57 yr old CF , who is widowed , unemployed , lives in [place] , has a hx of depression, anxiety, sleep problems, ulcerative colitis, GERD, hypothyroidism, vitamin B12 deficiency, arthritis, presented to the  clinic today for a follow-up visit.  Patient is biologically predisposed given her medical problems and mental health issues including her vitamin B12 deficiency.  She also has psychosocial stressors of the recent death of her husband, financial stressors.  Patient continues to be compliant with her medications and have good social support system.  She is also in psychotherapy with her therapist here in clinic which is going well.  She currently denies any suicidality.  Plan as noted below.  Plan MDD Increase Effexor to 75 mg p.o. daily Increase Seroquel to 75 mg p.o. nightly Continue psychotherapy with Elmyra Ricks  For Bereavement Continue grief counseling  For GAD  Increase Effexor to 75 mg p.o. daily. Seroquel as prescribed  Insomnia Pending sleep study Continue trazodone 25-50 mg p.o. nightly as needed  Patient with vitamin B12 deficiency, discussed with her to reach out to her primary medical doctor for the same.  Follow up in clinic in 3-4 weeks or sooner if needed.  More than 50 % of the time was spent for psychoeducation and supportive psychotherapy and care coordination. This Brady was generated in part or whole with voice recognition  software. Voice recognition is usually quite accurate but there are transcription errors that can and very often do occur. I apologize for any typographical errors that were not detected and corrected.           Ursula Alert, MD 04/22/2018, 3:13 PM

## 2018-04-23 ENCOUNTER — Encounter: Payer: Self-pay | Admitting: *Deleted

## 2018-04-24 ENCOUNTER — Other Ambulatory Visit: Payer: Self-pay

## 2018-04-24 ENCOUNTER — Encounter
Admission: RE | Disposition: A | Discharge status: Home / Self Care | Payer: Self-pay | Source: Ambulatory Visit | Attending: Unknown Physician Specialty

## 2018-04-24 ENCOUNTER — Ambulatory Visit
Admission: RE | Admit: 2018-04-24 | Discharge: 2018-04-24 | Disposition: A | Discharge status: Home / Self Care | Payer: Medicaid Other | Source: Ambulatory Visit | Attending: Unknown Physician Specialty | Admitting: Unknown Physician Specialty

## 2018-04-24 ENCOUNTER — Encounter: Payer: Self-pay | Admitting: *Deleted

## 2018-04-24 ENCOUNTER — Ambulatory Visit: Payer: Medicaid Other | Admitting: Anesthesiology

## 2018-04-24 DIAGNOSIS — Z7951 Long term (current) use of inhaled steroids: Secondary | ICD-10-CM | POA: Diagnosis not present

## 2018-04-24 DIAGNOSIS — G4730 Sleep apnea, unspecified: Secondary | ICD-10-CM | POA: Insufficient documentation

## 2018-04-24 DIAGNOSIS — Z79890 Hormone replacement therapy (postmenopausal): Secondary | ICD-10-CM | POA: Insufficient documentation

## 2018-04-24 DIAGNOSIS — E039 Hypothyroidism, unspecified: Secondary | ICD-10-CM | POA: Diagnosis not present

## 2018-04-24 DIAGNOSIS — F17210 Nicotine dependence, cigarettes, uncomplicated: Secondary | ICD-10-CM | POA: Insufficient documentation

## 2018-04-24 DIAGNOSIS — Z7982 Long term (current) use of aspirin: Secondary | ICD-10-CM | POA: Diagnosis not present

## 2018-04-24 DIAGNOSIS — Z79899 Other long term (current) drug therapy: Secondary | ICD-10-CM | POA: Diagnosis not present

## 2018-04-24 DIAGNOSIS — Z800 Family history of malignant neoplasm of digestive organs: Secondary | ICD-10-CM | POA: Insufficient documentation

## 2018-04-24 DIAGNOSIS — K2950 Unspecified chronic gastritis without bleeding: Secondary | ICD-10-CM | POA: Insufficient documentation

## 2018-04-24 DIAGNOSIS — F419 Anxiety disorder, unspecified: Secondary | ICD-10-CM | POA: Diagnosis not present

## 2018-04-24 DIAGNOSIS — K640 First degree hemorrhoids: Secondary | ICD-10-CM | POA: Diagnosis not present

## 2018-04-24 DIAGNOSIS — K2960 Other gastritis without bleeding: Secondary | ICD-10-CM | POA: Diagnosis not present

## 2018-04-24 DIAGNOSIS — K635 Polyp of colon: Secondary | ICD-10-CM | POA: Diagnosis not present

## 2018-04-24 DIAGNOSIS — Z1211 Encounter for screening for malignant neoplasm of colon: Secondary | ICD-10-CM | POA: Diagnosis present

## 2018-04-24 DIAGNOSIS — K210 Gastro-esophageal reflux disease with esophagitis: Secondary | ICD-10-CM | POA: Diagnosis not present

## 2018-04-24 DIAGNOSIS — D123 Benign neoplasm of transverse colon: Secondary | ICD-10-CM | POA: Diagnosis not present

## 2018-04-24 DIAGNOSIS — R1013 Epigastric pain: Secondary | ICD-10-CM | POA: Diagnosis not present

## 2018-04-24 DIAGNOSIS — I739 Peripheral vascular disease, unspecified: Secondary | ICD-10-CM | POA: Diagnosis not present

## 2018-04-24 DIAGNOSIS — F329 Major depressive disorder, single episode, unspecified: Secondary | ICD-10-CM | POA: Insufficient documentation

## 2018-04-24 DIAGNOSIS — K259 Gastric ulcer, unspecified as acute or chronic, without hemorrhage or perforation: Secondary | ICD-10-CM | POA: Diagnosis not present

## 2018-04-24 HISTORY — DX: Gastro-esophageal reflux disease without esophagitis: K21.9

## 2018-04-24 HISTORY — DX: Peripheral vascular disease, unspecified: I73.9

## 2018-04-24 HISTORY — DX: Hypothyroidism, unspecified: E03.9

## 2018-04-24 HISTORY — PX: ESOPHAGOGASTRODUODENOSCOPY: SHX5428

## 2018-04-24 HISTORY — DX: Other microscopic hematuria: R31.29

## 2018-04-24 HISTORY — DX: Unspecified osteoarthritis, unspecified site: M19.90

## 2018-04-24 HISTORY — DX: Gastritis, unspecified, without bleeding: K29.70

## 2018-04-24 HISTORY — DX: Duodenitis without bleeding: K29.80

## 2018-04-24 HISTORY — DX: Deficiency of other specified B group vitamins: E53.8

## 2018-04-24 HISTORY — DX: Hyperlipidemia, unspecified: E78.5

## 2018-04-24 HISTORY — DX: Plantar fascial fibromatosis: M72.2

## 2018-04-24 HISTORY — PX: COLONOSCOPY WITH PROPOFOL: SHX5780

## 2018-04-24 SURGERY — EGD (ESOPHAGOGASTRODUODENOSCOPY)
Anesthesia: General

## 2018-04-24 MED ORDER — FENTANYL CITRATE (PF) 100 MCG/2ML IJ SOLN
INTRAMUSCULAR | Status: AC
  Filled 2018-04-24: qty 2

## 2018-04-24 MED ORDER — PROPOFOL 500 MG/50ML IV EMUL
INTRAVENOUS | Status: DC | PRN
  Administered 2018-04-24: 170 ug/kg/min via INTRAVENOUS

## 2018-04-24 MED ORDER — FENTANYL CITRATE (PF) 100 MCG/2ML IJ SOLN
INTRAMUSCULAR | Status: DC | PRN
  Administered 2018-04-24: 25 ug via INTRAVENOUS
  Administered 2018-04-24: 50 ug via INTRAVENOUS
  Administered 2018-04-24: 25 ug via INTRAVENOUS

## 2018-04-24 MED ORDER — SODIUM CHLORIDE 0.9 % IV SOLN
INTRAVENOUS | Status: DC
  Administered 2018-04-24 (×2): via INTRAVENOUS

## 2018-04-24 MED ORDER — SODIUM CHLORIDE 0.9 % IV SOLN: INTRAVENOUS | Status: DC

## 2018-04-24 MED ORDER — LIDOCAINE 2% (20 MG/ML) 5 ML SYRINGE
INTRAMUSCULAR | Status: DC | PRN
  Administered 2018-04-24: 30 mg via INTRAVENOUS

## 2018-04-24 MED ORDER — PROPOFOL 10 MG/ML IV BOLUS
INTRAVENOUS | Status: DC | PRN
  Administered 2018-04-24: 100 mg via INTRAVENOUS

## 2018-04-24 MED ORDER — PROPOFOL 500 MG/50ML IV EMUL
INTRAVENOUS | Status: AC
  Filled 2018-04-24: qty 50

## 2018-04-24 NOTE — Op Note (Signed)
[facility] Gastroenterology Patient Name: Tamara Brady Procedure Date: 04/24/2018 11:40 AM MRN: [id number] Account #: [id number] Date of Birth: 05-15-1961 Admit Type: Outpatient Age: 57 Room: [facility] ENDO ROOM 3 Gender: Female Note Status: Finalized Procedure:            Colonoscopy Indications:          Family history of colon cancer in a first-degree                        relative Providers:            Manya Silvas, MD Referring MD:         Ramonita Lab, MD (Referring MD) Medicines:            Propofol per Anesthesia Complications:        No immediate complications. Procedure:            Pre-Anesthesia Assessment:                       - After reviewing the risks and benefits, the patient                        was deemed in satisfactory condition to undergo the                        procedure.                       After obtaining informed consent, the colonoscope was                        passed under direct vision. Throughout the procedure,                        the patient's blood pressure, pulse, and oxygen                        saturations were monitored continuously. The                        Colonoscope was introduced through the anus and                        advanced to the the cecum, identified by appendiceal                        orifice and ileocecal valve. The colonoscopy was                        performed without difficulty. The patient tolerated the                        procedure well. The quality of the bowel preparation                        was adequate to identify polyps. Findings:      Two sessile polyps were found in the transverse colon. The polyps were       diminutive in size. These polyps were removed with a hot snare.       Resection and retrieval were complete.      Internal hemorrhoids  were found during endoscopy. The hemorrhoids were       small and Grade I (internal hemorrhoids that do not prolapse).      Stool  scattered in mid and right colon, able to advance though.      The exam was otherwise without abnormality. Impression:           - Two diminutive polyps in the transverse colon,                        removed with a hot snare. Resected and retrieved.                       - Internal hemorrhoids.                       - The examination was otherwise normal. Recommendation:       - Await pathology results. Manya Silvas, MD 04/24/2018 12:21:55 PM This report has been signed electronically. Number of Addenda: 0 Note Initiated On: 04/24/2018 11:40 AM Scope Withdrawal Time: 0 hours 8 minutes 35 seconds  Total Procedure Duration: 0 hours 11 minutes 39 seconds       [facility]

## 2018-04-24 NOTE — H&P (Signed)
Primary Care Physician:  Adin Hector, MD Primary Gastroenterologist:  Dr. Vira Agar  Pre-Procedure History & Physical: HPI:  Tamara Brady is a 57 y.o. female is here for an colonoscopy and EGD due to Venturia colon cancer and epigastric pain.  Last procedure 02/18/13  endoscopy and colonoscopy.   Past Medical History:  Diagnosis Date  . Anxiety   . Arthritis   . Depression   . Duodenitis   . Gastritis   . GERD (gastroesophageal reflux disease)   . Hyperlipidemia   . Hypothyroidism   . Microscopic hematuria   . Plantar fibromatosis   . PVD (peripheral vascular disease) ([place])   . Thyroid disease   . Vitamin B 12 deficiency     Past Surgical History:  Procedure Laterality Date  . COLONOSCOPY  2014    Prior to Admission medications   Medication Sig Start Date End Date Taking? Authorizing Provider  fluticasone (FLONASE) 50 MCG/ACT nasal spray Place into both nostrils daily.   Yes [provider]  magnesium oxide (MAG-OX) 400 MG tablet Take by mouth.   Yes [provider]  QUEtiapine (SEROQUEL) 25 MG tablet Take 3 tablets (75 mg total) by mouth at bedtime. Mood and sleep 04/22/18  Yes Ursula Alert, MD  traZODone (DESYREL) 50 MG tablet Take 0.5-1 tablets (25-50 mg total) by mouth at bedtime. 04/08/18  Yes Ursula Alert, MD  venlafaxine XR (EFFEXOR XR) 75 MG 24 hr capsule Take 1 capsule (75 mg total) by mouth daily with breakfast. For mood 04/22/18  Yes Eappen, Saramma, MD  albuterol (PROVENTIL HFA;VENTOLIN HFA) 108 (90 Base) MCG/ACT inhaler Inhale into the lungs. 12/17/17   [provider]  aspirin EC 81 MG tablet Take by mouth.    [provider]  escitalopram (LEXAPRO) 10 MG tablet Take 10 mg by mouth daily.    [provider]  levothyroxine (SYNTHROID, LEVOTHROID) 137 MCG tablet TAKE 1 TABLET BY MOUTH EVERY DAY ON AN EMPTY STOMACH AT LEAST 30-60 MINS BEFORE BREAKFAST 03/21/18   [provider]    Allergies as of  02/22/2018  . (No Known Allergies)    Family History  Problem Relation Age of Onset  . Breast cancer Mother   . Colon cancer Mother   . Anxiety disorder Brother   . Depression Brother     Social History   Socioeconomic History  . Marital status: Married    Spouse name: Not on file  . Number of children: 2  . Years of education: Not on file  . Highest education level: GED or equivalent  Occupational History  . Not on file  Social Needs  . Financial resource strain: Very hard  . Food insecurity:    Worry: Often true    Inability: Often true  . Transportation needs:    Medical: No    Non-medical: No  Tobacco Use  . Smoking status: Current Every Day Smoker    Packs/day: 0.50    Types: Cigarettes  . Smokeless tobacco: Never Used  Substance and Sexual Activity  . Alcohol use: Not Currently    Frequency: Never  . Drug use: No  . Sexual activity: Not on file  Lifestyle  . Physical activity:    Days per week: 0 days    Minutes per session: 0 min  . Stress: Very much  Relationships  . Social connections:    Talks on phone: Not on file    Gets together: Not on file    Attends  religious service: More than 4 times per year    Active member of club or organization: No    Attends meetings of clubs or organizations: Never    Relationship status: Widowed  . Intimate partner violence:    Fear of current or ex partner: No    Emotionally abused: No    Physically abused: No    Forced sexual activity: No  Other Topics Concern  . Not on file  Social History Narrative  . Not on file    Review of Systems: See HPI, otherwise negative ROS  Physical Exam: BP 130/65   Pulse 83   Temp 98.3 F (36.8 C) (Oral)   Resp 17   SpO2 100%  General:   Alert,  pleasant and cooperative in NAD Head:  Normocephalic and atraumatic. Neck:  Supple; no masses or thyromegaly. Lungs:  Clear throughout to auscultation.    Heart:  Regular rate and rhythm. Abdomen:  Soft, nontender and  nondistended. Normal bowel sounds, without guarding, and without rebound.   Neurologic:  Alert and  oriented x4;  grossly normal neurologically.  Impression/Plan: Tamara Brady is here for an endoscopy and colonoscopy to be performed for FH of colon cancer in mother and epigastric pain.  Risks, benefits, limitations, and alternatives regarding  endoscopy and colonoscopy have been reviewed with the patient.  Questions have been answered.  All parties agreeable.   Gaylyn Cheers, MD  04/24/2018, 11:47 AM

## 2018-04-24 NOTE — Transfer of Care (Signed)
Immediate Anesthesia Transfer of Care Note  Patient: Tamara Brady  Procedure(s) Performed: ESOPHAGOGASTRODUODENOSCOPY (EGD) (N/A ) COLONOSCOPY WITH PROPOFOL (N/A )  Patient Location: PACU and Endoscopy Unit  Anesthesia Type:General  Level of Consciousness: sedated and drowsy  Airway & Oxygen Therapy: Patient Spontanous Breathing and Patient connected to nasal cannula oxygen  Post-op Assessment: Report given to RN and Post -op Vital signs reviewed and stable  Post vital signs: Reviewed and stable  Last Vitals:  Vitals Value Taken Time  BP    Temp    Pulse 96 04/24/2018 12:24 PM  Resp 22 04/24/2018 12:24 PM  SpO2 100 % 04/24/2018 12:24 PM  Vitals shown include unvalidated device data.  Last Pain:  Vitals:   04/24/18 1041  TempSrc: Oral         Complications: No apparent anesthesia complications

## 2018-04-24 NOTE — Anesthesia Postprocedure Evaluation (Signed)
Anesthesia Post Note  Patient: Adaeze Better Daniele  Procedure(s) Performed: ESOPHAGOGASTRODUODENOSCOPY (EGD) (N/A ) COLONOSCOPY WITH PROPOFOL (N/A )  Patient location during evaluation: Endoscopy Anesthesia Type: General Level of consciousness: awake and alert and oriented Pain management: pain level controlled Vital Signs Assessment: post-procedure vital signs reviewed and stable Respiratory status: spontaneous breathing, nonlabored ventilation and respiratory function stable Cardiovascular status: blood pressure returned to baseline and stable Postop Assessment: no signs of nausea or vomiting Anesthetic complications: no     Last Vitals:  Vitals:   04/24/18 1225 04/24/18 1230  BP:  107/71  Pulse: 98   Resp: 20   Temp: (!) 36.1 C   SpO2: 100%     Last Pain:  Vitals:   04/24/18 1300  TempSrc:   PainSc: 0-No pain                 Garrie Elenes

## 2018-04-24 NOTE — Anesthesia Preprocedure Evaluation (Signed)
Anesthesia Evaluation  Patient identified by MRN, date of birth, ID band Patient awake    Reviewed: Allergy & Precautions, NPO status , Patient's Chart, lab work & pertinent test results  History of Anesthesia Complications Negative for: history of anesthetic complications  Airway Mallampati: III  TM Distance: >3 FB Neck ROM: Full    Dental  (+) Partial Upper   Pulmonary sleep apnea (diagnosed but has not gotten CPAP yet) , neg COPD, Current Smoker,    breath sounds clear to auscultation- rhonchi (-) wheezing      Cardiovascular Exercise Tolerance: Good (-) hypertension(-) CAD, (-) Past MI, (-) Cardiac Stents and (-) CABG  Rhythm:Regular Rate:Normal - Systolic murmurs and - Diastolic murmurs    Neuro/Psych PSYCHIATRIC DISORDERS Anxiety Depression negative neurological ROS     GI/Hepatic Neg liver ROS, GERD  ,  Endo/Other  neg diabetesHypothyroidism   Renal/GU negative Renal ROS     Musculoskeletal  (+) Arthritis ,   Abdominal (+) + obese,   Peds  Hematology negative hematology ROS (+)   Anesthesia Other Findings Past Medical History: No date: Anxiety No date: Arthritis No date: Depression No date: Duodenitis No date: Gastritis No date: GERD (gastroesophageal reflux disease) No date: Hyperlipidemia No date: Hypothyroidism No date: Microscopic hematuria No date: Plantar fibromatosis No date: PVD (peripheral vascular disease) (HCC) No date: Thyroid disease No date: Vitamin B 12 deficiency   Reproductive/Obstetrics                             Anesthesia Physical Anesthesia Plan  ASA: II  Anesthesia Plan: General   Post-op Pain Management:    Induction: Intravenous  PONV Risk Score and Plan: 1 and Propofol infusion  Airway Management Planned: Natural Airway  Additional Equipment:   Intra-op Plan:   Post-operative Plan:   Informed Consent: I have reviewed the patients  History and Physical, chart, labs and discussed the procedure including the risks, benefits and alternatives for the proposed anesthesia with the patient or authorized representative who has indicated his/her understanding and acceptance.   Dental advisory given  Plan Discussed with: CRNA and Anesthesiologist  Anesthesia Plan Comments:         Anesthesia Quick Evaluation

## 2018-04-24 NOTE — Anesthesia Post-op Follow-up Note (Signed)
Anesthesia QCDR form completed.        

## 2018-04-24 NOTE — Op Note (Signed)
[facility] Gastroenterology Patient Name: Tamara Brady Procedure Date: 04/24/2018 11:40 AM MRN: [id number] Account #: [id number] Date of Birth: September 21, 1960 Admit Type: Outpatient Age: 57 Room: [facility] ENDO ROOM 3 Gender: Female Note Status: Finalized Procedure:            Upper GI endoscopy Indications:          Epigastric abdominal pain Providers:            Manya Silvas, MD Referring MD:         Ramonita Lab, MD (Referring MD) Medicines:            Propofol per Anesthesia Complications:        No immediate complications. Procedure:            Pre-Anesthesia Assessment:                       - After reviewing the risks and benefits, the patient                        was deemed in satisfactory condition to undergo the                        procedure.                       After obtaining informed consent, the endoscope was                        passed under direct vision. Throughout the procedure,                        the patient's blood pressure, pulse, and oxygen                        saturations were monitored continuously. The Endoscope                        was introduced through the mouth, and advanced to the                        second part of duodenum. The upper GI endoscopy was                        accomplished without difficulty. The patient tolerated                        the procedure well. Findings:      LA Grade A (one or more mucosal breaks less than 5 mm, not extending       between tops of 2 mucosal folds) esophagitis with no bleeding was found       40 cm from the incisors. Biopsies were taken with a cold forceps for       histology.      Two non-bleeding superficial gastric ulcers with no stigmata of bleeding       were found in the gastric body. The largest lesion was 3 mm in largest       dimension. Biopsies were taken with a cold forceps for histology.       Biopsies were taken with a cold forceps for Helicobacter pylori  testing.      Patchy mild  inflammation characterized by erythema and granularity was       found in the gastric antrum. Biopsies were taken with a cold forceps for       histology.      The examined duodenum was normal. Impression:           - LA Grade A reflux esophagitis. Rule out Barrett's                        esophagus. Biopsied.                       - Non-bleeding gastric ulcers with no stigmata of                        bleeding. Biopsied.                       - Gastritis. Biopsied.                       - Normal examined duodenum. Recommendation:       - Await pathology results. Manya Silvas, MD 04/24/2018 12:02:11 PM This report has been signed electronically. Number of Addenda: 0 Note Initiated On: 04/24/2018 11:40 AM      [facility]

## 2018-04-26 ENCOUNTER — Encounter: Payer: Self-pay | Admitting: Unknown Physician Specialty

## 2018-04-26 LAB — SURGICAL PATHOLOGY

## 2018-04-30 ENCOUNTER — Ambulatory Visit: Payer: Medicaid Other | Admitting: Licensed Clinical Social Worker

## 2018-05-14 ENCOUNTER — Other Ambulatory Visit: Payer: Self-pay | Admitting: Psychiatry

## 2018-05-14 DIAGNOSIS — F331 Major depressive disorder, recurrent, moderate: Secondary | ICD-10-CM

## 2018-05-14 DIAGNOSIS — F5105 Insomnia due to other mental disorder: Secondary | ICD-10-CM

## 2018-05-14 DIAGNOSIS — F411 Generalized anxiety disorder: Secondary | ICD-10-CM

## 2018-06-30 ENCOUNTER — Other Ambulatory Visit: Payer: Self-pay | Admitting: Psychiatry

## 2018-06-30 DIAGNOSIS — F331 Major depressive disorder, recurrent, moderate: Secondary | ICD-10-CM

## 2018-06-30 DIAGNOSIS — F411 Generalized anxiety disorder: Secondary | ICD-10-CM

## 2018-07-30 ENCOUNTER — Other Ambulatory Visit: Payer: Self-pay | Admitting: Psychiatry

## 2018-07-30 DIAGNOSIS — F411 Generalized anxiety disorder: Secondary | ICD-10-CM

## 2018-07-30 DIAGNOSIS — F331 Major depressive disorder, recurrent, moderate: Secondary | ICD-10-CM

## 2018-08-26 ENCOUNTER — Other Ambulatory Visit: Payer: Self-pay | Admitting: Psychiatry

## 2018-08-26 DIAGNOSIS — F331 Major depressive disorder, recurrent, moderate: Secondary | ICD-10-CM

## 2018-08-26 DIAGNOSIS — F411 Generalized anxiety disorder: Secondary | ICD-10-CM

## 2018-09-30 ENCOUNTER — Other Ambulatory Visit: Payer: Self-pay | Admitting: Internal Medicine

## 2018-09-30 ENCOUNTER — Ambulatory Visit
Admission: RE | Admit: 2018-09-30 | Discharge: 2018-09-30 | Disposition: A | Discharge status: Home / Self Care | Payer: Medicaid Other | Source: Ambulatory Visit | Attending: Internal Medicine | Admitting: Internal Medicine

## 2018-09-30 ENCOUNTER — Other Ambulatory Visit: Payer: Self-pay

## 2018-09-30 DIAGNOSIS — Z1231 Encounter for screening mammogram for malignant neoplasm of breast: Secondary | ICD-10-CM | POA: Diagnosis not present

## 2018-10-01 ENCOUNTER — Telehealth: Payer: Self-pay | Admitting: *Deleted

## 2018-10-01 NOTE — Telephone Encounter (Signed)
Received referral for initial lung cancer screening scan. Contacted patient and obtained smoking history,(current, 41 pack year) as well as answering questions related to screening process. Patient denies signs of lung cancer such as weight loss or hemoptysis. Patient denies comorbidity that would prevent curative treatment if lung cancer were found. Patient will be scheduled for lung screening scan as soon as restrictions for [facility] are lifted.

## 2018-10-14 ENCOUNTER — Other Ambulatory Visit: Payer: Self-pay | Admitting: Psychiatry

## 2018-10-14 DIAGNOSIS — F331 Major depressive disorder, recurrent, moderate: Secondary | ICD-10-CM

## 2018-10-14 DIAGNOSIS — F5105 Insomnia due to other mental disorder: Secondary | ICD-10-CM

## 2018-12-17 ENCOUNTER — Telehealth: Payer: Self-pay | Admitting: *Deleted

## 2018-12-17 DIAGNOSIS — Z87891 Personal history of nicotine dependence: Secondary | ICD-10-CM

## 2018-12-17 DIAGNOSIS — Z122 Encounter for screening for malignant neoplasm of respiratory organs: Secondary | ICD-10-CM

## 2018-12-17 NOTE — Telephone Encounter (Signed)
Received referral for initial lung cancer screening scan. Contacted patient and obtained smoking history,(current, 41 pack year) as well as answering questions related to screening process. Patient denies signs of lung cancer such as weight loss or hemoptysis. Patient denies comorbidity that would prevent curative treatment if lung cancer were found. Patient is scheduled for shared decision making visit and CT scan on 12/25/18 at 930am.

## 2018-12-25 ENCOUNTER — Other Ambulatory Visit: Payer: Self-pay

## 2018-12-25 ENCOUNTER — Ambulatory Visit: Payer: Medicaid Other | Admitting: Oncology

## 2018-12-25 ENCOUNTER — Inpatient Hospital Stay: Payer: Medicaid Other | Admitting: Oncology

## 2018-12-25 ENCOUNTER — Ambulatory Visit
Admission: RE | Admit: 2018-12-25 | Discharge: 2018-12-25 | Disposition: A | Discharge status: Home / Self Care | Payer: Medicaid Other | Source: Ambulatory Visit | Attending: Oncology | Admitting: Oncology

## 2018-12-25 ENCOUNTER — Ambulatory Visit: Admission: RE | Admit: 2018-12-25 | Payer: Medicaid Other | Source: Ambulatory Visit

## 2018-12-25 ENCOUNTER — Encounter: Payer: Self-pay | Admitting: Oncology

## 2018-12-25 DIAGNOSIS — Z122 Encounter for screening for malignant neoplasm of respiratory organs: Secondary | ICD-10-CM | POA: Insufficient documentation

## 2018-12-25 DIAGNOSIS — Z87891 Personal history of nicotine dependence: Secondary | ICD-10-CM | POA: Insufficient documentation

## 2018-12-27 ENCOUNTER — Encounter: Payer: Self-pay | Admitting: *Deleted

## 2019-04-28 IMAGING — DX DG HAND COMPLETE 3+V*L*
3 series · 3 of 3 positions shown · non-contrast
Comparison: None.

CLINICAL DATA: Laceration to the index finger.

EXAM:
LEFT HAND - COMPLETE 3+ VIEW

[hand ap]
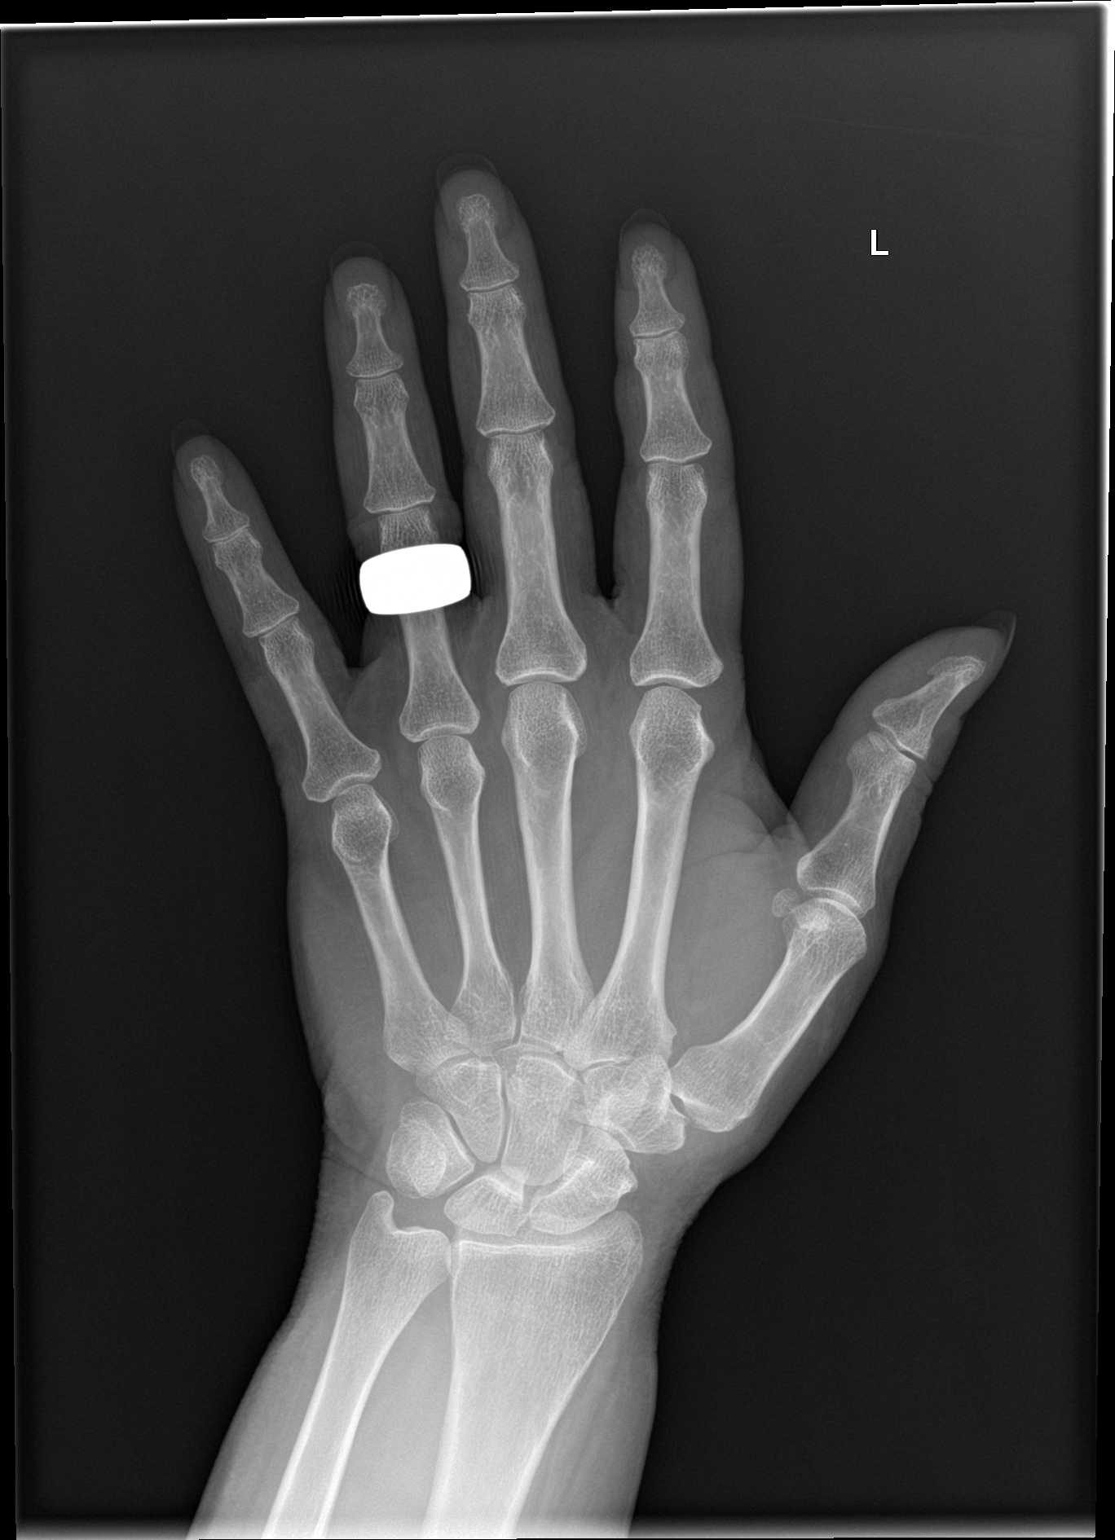

[hand obl]
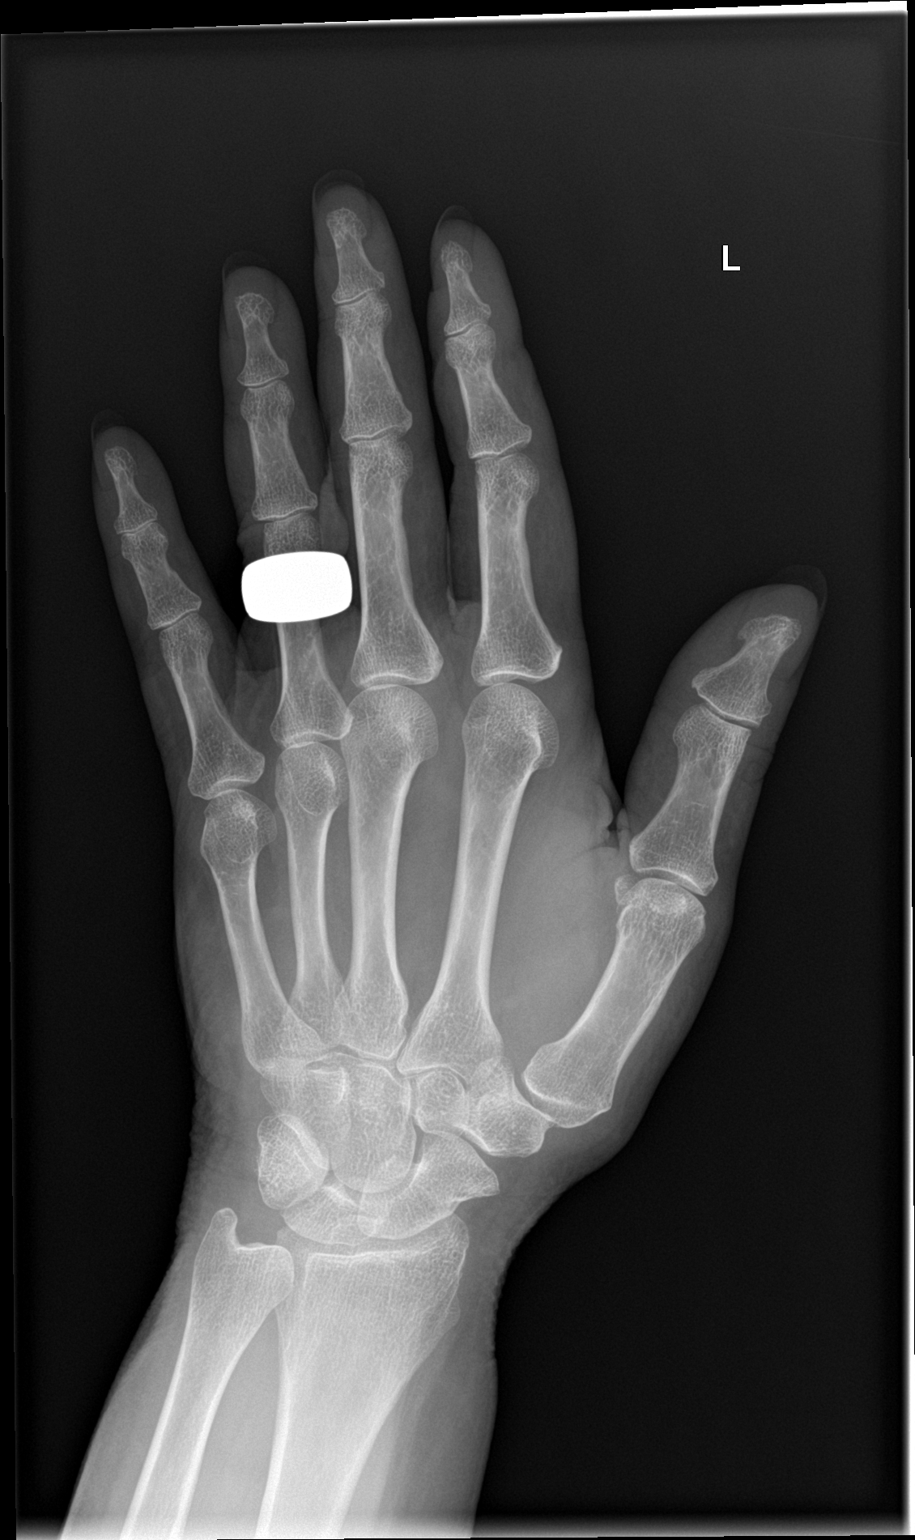

[hand lat]
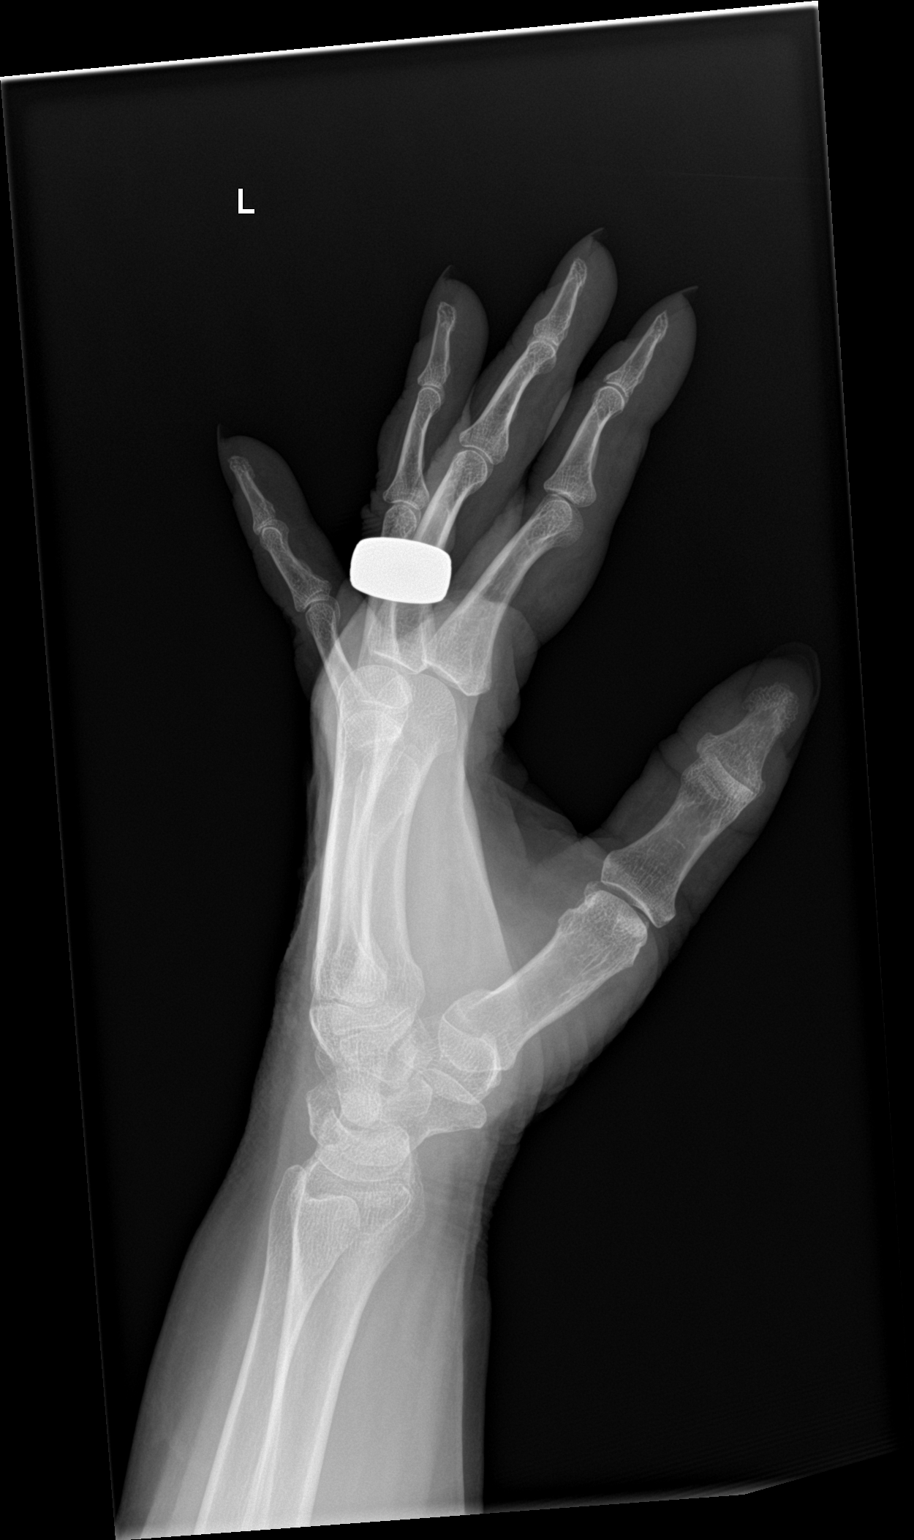

[3 of 3 positions shown; findings below may reference images not displayed]

FINDINGS: Normal anatomic alignment. No evidence for acute fracture or
dislocation. Regional soft tissues are unremarkable.
IMPRESSION: No acute osseous abnormality.

## 2019-05-28 ENCOUNTER — Other Ambulatory Visit: Payer: Self-pay | Admitting: Psychiatry

## 2019-05-28 DIAGNOSIS — F5105 Insomnia due to other mental disorder: Secondary | ICD-10-CM

## 2019-05-28 DIAGNOSIS — F331 Major depressive disorder, recurrent, moderate: Secondary | ICD-10-CM

## 2019-12-22 ENCOUNTER — Telehealth: Payer: Self-pay

## 2019-12-22 DIAGNOSIS — Z87891 Personal history of nicotine dependence: Secondary | ICD-10-CM

## 2019-12-22 DIAGNOSIS — Z122 Encounter for screening for malignant neoplasm of respiratory organs: Secondary | ICD-10-CM

## 2019-12-22 NOTE — Telephone Encounter (Signed)
Patient has been notified that the low dose lung cancer screening CT scan is due currently or will be in near future.  Confirmed that patient is within the appropriate age range and asymptomatic, (no signs or symptoms of lung cancer).  Patient denies illness that would prevent curative treatment for lung cancer if found.  Patient is agreeable for CT scan being scheduled.    Verified smoking history (current smoker, with 41 year 5-6 cigarettes per day history (recent decrease in smoking).   CT scheduled for 01/01/20 @ 9:30

## 2019-12-23 NOTE — Addendum Note (Signed)
Addended by: Lieutenant Diego on: 12/23/2019 10:37 AM   Modules accepted: Orders

## 2019-12-23 NOTE — Telephone Encounter (Signed)
Smoking history: current, 41.25 pack year

## 2020-01-01 ENCOUNTER — Ambulatory Visit
Admission: RE | Admit: 2020-01-01 | Discharge: 2020-01-01 | Disposition: A | Discharge status: Home / Self Care | Payer: Medicaid Other | Source: Ambulatory Visit | Attending: Oncology | Admitting: Oncology

## 2020-01-01 ENCOUNTER — Other Ambulatory Visit: Payer: Self-pay

## 2020-01-01 DIAGNOSIS — Z87891 Personal history of nicotine dependence: Secondary | ICD-10-CM

## 2020-01-01 DIAGNOSIS — Z122 Encounter for screening for malignant neoplasm of respiratory organs: Secondary | ICD-10-CM | POA: Diagnosis present

## 2020-01-06 ENCOUNTER — Encounter: Payer: Self-pay | Admitting: *Deleted

## 2020-12-29 ENCOUNTER — Telehealth: Payer: Self-pay

## 2020-12-29 NOTE — Telephone Encounter (Signed)
Called patient to schedule annual lung screening CT scan. Patient states that she doesn't have a car but she will call back when she figures out transportation.

## 2021-04-21 ENCOUNTER — Other Ambulatory Visit: Payer: Self-pay | Admitting: Internal Medicine

## 2021-04-21 DIAGNOSIS — R079 Chest pain, unspecified: Secondary | ICD-10-CM

## 2021-04-21 DIAGNOSIS — R2242 Localized swelling, mass and lump, left lower limb: Secondary | ICD-10-CM

## 2021-04-21 DIAGNOSIS — I739 Peripheral vascular disease, unspecified: Secondary | ICD-10-CM

## 2021-04-21 DIAGNOSIS — E7849 Other hyperlipidemia: Secondary | ICD-10-CM

## 2021-04-21 DIAGNOSIS — R600 Localized edema: Secondary | ICD-10-CM

## 2021-05-04 ENCOUNTER — Telehealth (HOSPITAL_COMMUNITY): Payer: Self-pay | Admitting: Emergency Medicine

## 2021-05-04 DIAGNOSIS — I739 Peripheral vascular disease, unspecified: Secondary | ICD-10-CM

## 2021-05-04 MED ORDER — METOPROLOL TARTRATE 100 MG PO TABS: 100.0000 mg | ORAL_TABLET | Freq: Once | ORAL | 0 refills | Status: AC

## 2021-05-04 NOTE — Telephone Encounter (Signed)
Reaching out to patient to offer assistance regarding upcoming cardiac imaging study; pt verbalizes understanding of appt date/time, parking situation and where to check in, pre-test NPO status and medications ordered, and verified current allergies; name and call back number provided for further questions should they arise Marchia Bond RN Navigator Cardiac Imaging [facility] [phone] office [phone] cell  100mg  metoprolol tart sent to her pharm  Difficult IV start

## 2021-05-05 ENCOUNTER — Other Ambulatory Visit: Payer: Self-pay

## 2021-05-05 ENCOUNTER — Ambulatory Visit
Admission: RE | Admit: 2021-05-05 | Discharge: 2021-05-05 | Disposition: A | Discharge status: Home / Self Care | Payer: Medicare Other | Source: Ambulatory Visit | Attending: Internal Medicine | Admitting: Internal Medicine

## 2021-05-05 DIAGNOSIS — R600 Localized edema: Secondary | ICD-10-CM | POA: Insufficient documentation

## 2021-05-05 DIAGNOSIS — R2242 Localized swelling, mass and lump, left lower limb: Secondary | ICD-10-CM | POA: Insufficient documentation

## 2021-05-05 DIAGNOSIS — R079 Chest pain, unspecified: Secondary | ICD-10-CM | POA: Diagnosis not present

## 2021-05-05 DIAGNOSIS — R0609 Other forms of dyspnea: Secondary | ICD-10-CM | POA: Diagnosis not present

## 2021-05-05 DIAGNOSIS — E7849 Other hyperlipidemia: Secondary | ICD-10-CM | POA: Insufficient documentation

## 2021-05-05 DIAGNOSIS — I739 Peripheral vascular disease, unspecified: Secondary | ICD-10-CM | POA: Diagnosis not present

## 2021-05-05 MED ORDER — NITROGLYCERIN 0.4 MG SL SUBL
0.8000 mg | SUBLINGUAL_TABLET | Freq: Once | SUBLINGUAL | Status: AC
  Administered 2021-05-05: 0.8 mg via SUBLINGUAL

## 2021-05-05 MED ORDER — IOHEXOL 350 MG/ML SOLN
125.0000 mL | Freq: Once | INTRAVENOUS | Status: AC | PRN
  Administered 2021-05-05: 125 mL via INTRAVENOUS

## 2021-05-05 NOTE — Progress Notes (Signed)
Patient tolerated CT well. Drank water and a soda after. Vital signs stable encourage to drink water throughout day.Reasons explained and verbalized understanding. Ambulated steady gait.

## 2021-07-28 ENCOUNTER — Other Ambulatory Visit: Payer: Self-pay | Admitting: *Deleted

## 2021-07-28 DIAGNOSIS — F17210 Nicotine dependence, cigarettes, uncomplicated: Secondary | ICD-10-CM

## 2021-07-28 DIAGNOSIS — Z87891 Personal history of nicotine dependence: Secondary | ICD-10-CM

## 2021-08-10 ENCOUNTER — Ambulatory Visit: Payer: Medicare Other

## 2022-02-13 ENCOUNTER — Ambulatory Visit
Admission: RE | Admit: 2022-02-13 | Discharge: 2022-02-13 | Disposition: A | Discharge status: Home / Self Care | Payer: Medicare Other | Source: Ambulatory Visit | Attending: Acute Care | Admitting: Acute Care

## 2022-02-13 DIAGNOSIS — F17210 Nicotine dependence, cigarettes, uncomplicated: Secondary | ICD-10-CM | POA: Insufficient documentation

## 2022-02-13 DIAGNOSIS — Z87891 Personal history of nicotine dependence: Secondary | ICD-10-CM | POA: Insufficient documentation

## 2022-02-14 ENCOUNTER — Other Ambulatory Visit: Payer: Self-pay

## 2022-02-14 DIAGNOSIS — Z122 Encounter for screening for malignant neoplasm of respiratory organs: Secondary | ICD-10-CM

## 2022-02-14 DIAGNOSIS — F17210 Nicotine dependence, cigarettes, uncomplicated: Secondary | ICD-10-CM

## 2022-02-14 DIAGNOSIS — Z87891 Personal history of nicotine dependence: Secondary | ICD-10-CM

## 2022-02-23 ENCOUNTER — Other Ambulatory Visit: Payer: Self-pay | Admitting: Internal Medicine

## 2022-02-23 DIAGNOSIS — R200 Anesthesia of skin: Secondary | ICD-10-CM

## 2022-03-06 ENCOUNTER — Ambulatory Visit
Admission: RE | Admit: 2022-03-06 | Discharge: 2022-03-06 | Disposition: A | Discharge status: Home / Self Care | Payer: Medicare Other | Source: Ambulatory Visit | Attending: Internal Medicine | Admitting: Internal Medicine

## 2022-03-06 DIAGNOSIS — R202 Paresthesia of skin: Secondary | ICD-10-CM

## 2022-05-05 ENCOUNTER — Other Ambulatory Visit: Payer: Self-pay | Admitting: Internal Medicine

## 2022-05-05 DIAGNOSIS — Z1231 Encounter for screening mammogram for malignant neoplasm of breast: Secondary | ICD-10-CM

## 2022-05-30 ENCOUNTER — Ambulatory Visit: Payer: Medicare Other

## 2022-07-04 ENCOUNTER — Ambulatory Visit
Admission: RE | Admit: 2022-07-04 | Discharge: 2022-07-04 | Disposition: A | Discharge status: Home / Self Care | Payer: Medicare Other | Source: Ambulatory Visit | Attending: Internal Medicine | Admitting: Internal Medicine

## 2022-07-04 DIAGNOSIS — Z1231 Encounter for screening mammogram for malignant neoplasm of breast: Secondary | ICD-10-CM | POA: Diagnosis present

## 2022-10-05 ENCOUNTER — Emergency Department
Admission: EM | Admit: 2022-10-05 | Discharge: 2022-10-05 | Disposition: A | Discharge status: Home / Self Care | Payer: 59 | Attending: Emergency Medicine | Admitting: Emergency Medicine

## 2022-10-05 ENCOUNTER — Other Ambulatory Visit: Payer: Self-pay

## 2022-10-05 DIAGNOSIS — E039 Hypothyroidism, unspecified: Secondary | ICD-10-CM | POA: Insufficient documentation

## 2022-10-05 DIAGNOSIS — U071 COVID-19: Secondary | ICD-10-CM | POA: Diagnosis not present

## 2022-10-05 DIAGNOSIS — R059 Cough, unspecified: Secondary | ICD-10-CM | POA: Diagnosis present

## 2022-10-05 LAB — RESP PANEL BY RT-PCR (RSV, FLU A&B, COVID)  RVPGX2
Influenza A by PCR: NEGATIVE
Influenza B by PCR: NEGATIVE
Resp Syncytial Virus by PCR: NEGATIVE
SARS Coronavirus 2 by RT PCR: POSITIVE — AB

## 2022-10-05 MED ORDER — DEXAMETHASONE SODIUM PHOSPHATE 10 MG/ML IJ SOLN
10.0000 mg | Freq: Once | INTRAMUSCULAR | Status: AC
  Administered 2022-10-05: 10 mg via INTRAVENOUS
  Filled 2022-10-05: qty 1

## 2022-10-05 MED ORDER — SODIUM CHLORIDE 0.9 % IV BOLUS
1000.0000 mL | Freq: Once | INTRAVENOUS | Status: AC
  Administered 2022-10-05: 1000 mL via INTRAVENOUS

## 2022-10-05 MED ORDER — GUAIFENESIN-CODEINE 100-10 MG/5ML PO SYRP: 5.0000 mL | ORAL_SOLUTION | Freq: Three times a day (TID) | ORAL | 0 refills | Status: AC | PRN

## 2022-10-05 NOTE — ED Provider Notes (Signed)
[facility] Provider Note    Event Date/Time   First MD Initiated Contact with Patient 10/05/22 1751     (approximate)   History   Emesis   HPI  Tamara Brady is a 62 y.o. female  with history of PVD, anxiety, arthritis, hypothyroidism and as listed in EMR presents to the emergency department for treatment and evaluation of headache, body ache, cough, sinus pressure, sore throat for the past 3 days.  She had been vomiting until yesterday.  She is now able to tolerate fluids.  No known fever.  No known exposure to COVID or flu.Marland Kitchen      Physical Exam   Triage Vital Signs: ED Triage Vitals [10/05/22 1647]  Enc Vitals Group     BP (!) 147/110     Pulse Rate 90     Resp 18     Temp 98.7 F (37.1 C)     Temp Source Oral     SpO2 96 %     Weight      Height      Head Circumference      Peak Flow      Pain Score 10     Pain Loc      Pain Edu?      Excl. in [place]?     Most recent vital signs: Vitals:   10/05/22 1647 10/05/22 2005  BP: (!) 147/110 (!) 144/98  Pulse: 90 80  Resp: 18 18  Temp: 98.7 F (37.1 C)   SpO2: 96% 94%    General: Awake, no distress.  CV:  Good peripheral perfusion.  Resp:  Normal effort.  Breath sounds clear Abd:  No distention.  No focal abdominal tenderness Other:     ED Results / Procedures / Treatments   Labs (all labs ordered are listed, but only abnormal results are displayed) Labs Reviewed  RESP PANEL BY RT-PCR (RSV, FLU A&B, COVID)  RVPGX2 - Abnormal; Notable for the following components:      Result Value   SARS Coronavirus 2 by RT PCR POSITIVE (*)    All other components within normal limits     EKG  Not indicated   RADIOLOGY  Image and radiology report reviewed and interpreted by me. Radiology report consistent with the same.  Not indicated  PROCEDURES:  Critical Care performed: No  Procedures   MEDICATIONS ORDERED IN ED:  Medications  sodium chloride 0.9 % bolus 1,000 mL  (0 mLs Intravenous Stopped 10/05/22 2006)  dexamethasone (DECADRON) injection 10 mg (10 mg Intravenous Given 10/05/22 1820)     IMPRESSION / MDM / ASSESSMENT AND PLAN / ED COURSE   I have reviewed the triage note.  Differential diagnosis includes, but is not limited to, COVID, influenza, viral syndrome  Patient's presentation is most consistent with acute illness / injury with system symptoms.  62 year old female presenting to the emergency department for treatment and evaluation of viral symptoms as listed in the HPI.  Plan will be to get COVID, influenza, and RSV testing.  COVID-positive.  She will receive fluid bolus and Decadron.  She is outside of the window for antivirals.  Results and home care discussed with the patient.      FINAL CLINICAL IMPRESSION(S) / ED DIAGNOSES   Final diagnoses:  COVID     Rx / DC Orders   ED Discharge Orders          Ordered    guaiFENesin-codeine (ROBITUSSIN AC) 100-10 MG/5ML syrup  3 times daily PRN        10/05/22 2033             Note:  This document was prepared using Dragon voice recognition software and may include unintentional dictation errors.   Victorino Dike, FNP 10/05/22 2236    Carrie Mew, MD 10/06/22 2001

## 2022-10-05 NOTE — ED Triage Notes (Signed)
Pt c/o headaches, body aches, cough, sinus pressure, and sore throat since Monday- states she was throwing up green bile yesterday. Pt is AOX4, NAD noted.

## 2022-12-11 IMAGING — CT CT HEART MORP W/ CTA COR W/ SCORE W/ CA W/CM &/OR W/O CM
1 of 16 series · 2 of 20 positions shown, 3 images · non-contrast
Comparison: Comparison made with January 01, 2020.

Addendum:
CLINICAL DATA: Dyspnea on exertion

EXAM:
Cardiac/Coronary  CTA
TECHNIQUE: The patient was scanned on a Siemens Somatom go.Top scanner.

[Series 27: multiphase % cta coronary 0.80 · axial · 0.36mm/px · z∈[-1080,-1045]mm · 2 of 2532 slices shown, 3 images]
[im 844/2532  vessel]
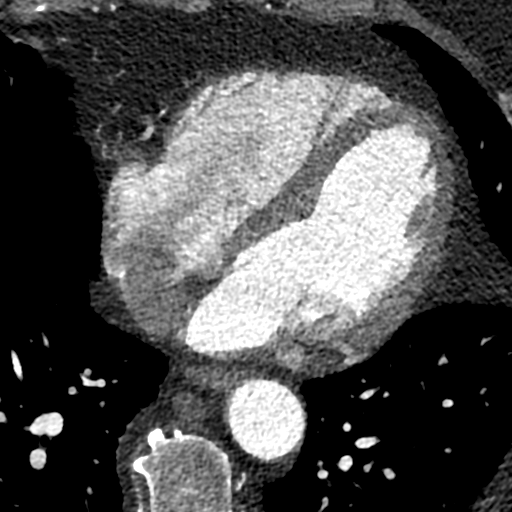
[im 844/2532  lung]
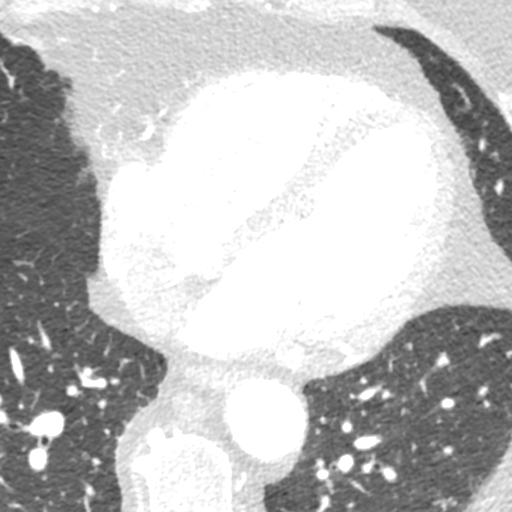
[im 1688/2532  vessel]
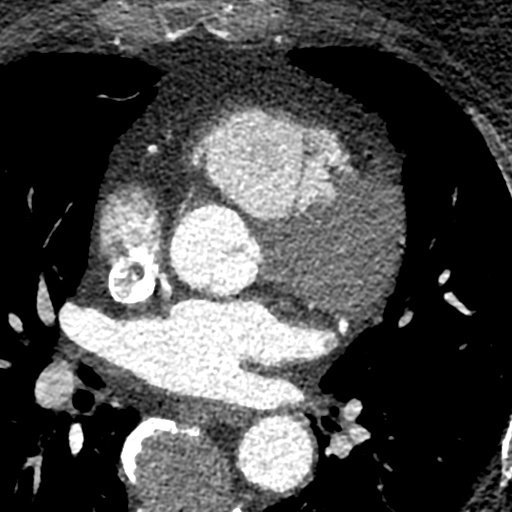

[2 of 20 positions shown; findings below may reference images not displayed]



Aortic Valve:  Trileaflet.  No calcifications.

Coronary Arteries:  Normal coronary origin.  Right dominance.

RCA is a large dominant artery.  there is no plaque.

Left main is a large artery that gives rise to LAD and LCX arteries.

LAD is a large vessel that has no plaque.

LCX is a non-dominant artery that gives rise to one large OM1
branch. There is no plaque.

Other findings:

Normal pulmonary vein drainage into the left atrium.

Normal left atrial appendage without a thrombus.

Normal size of the pulmonary artery.
IMPRESSION: 1. Normal coronary calcium score of 0. Patient is low risk for
coronary events.

2. Normal coronary origin with right dominance.

3. No evidence of CAD.

4. CAD-RADS 0. No evidence of CAD (0%). Consider non-atherosclerotic
causes of chest pain.

5. Image quality degraded by obesity related artifacts.

EXAM:
OVER-READ INTERPRETATION  CT CHEST

The following report is an over-read performed by radiologist Dr.
over-read does not include interpretation of cardiac or coronary
anatomy or pathology. The coronary calcium score and cardiac CT
angiography interpretation by the cardiologist is attached.
FINDINGS: Cardiovascular: See dedicated report for cardiac findings.

Mediastinum/Nodes: No adenopathy in the visualized chest. Esophagus
grossly unremarkable.

Lungs/Pleura: Visualized lungs are clear. Airways are patent.
Imaging targets the mid chest and cardiac structures only.

Upper Abdomen: No acute upper abdominal findings to the extent
evaluated

Musculoskeletal: No acute or destructive bone process. Spinal
degenerative changes.
IMPRESSION: No acute or significant extracardiac findings.



Aortic Valve:  Trileaflet.  No calcifications.

Coronary Arteries:  Normal coronary origin.  Right dominance.

RCA is a large dominant artery.  there is no plaque.

Left main is a large artery that gives rise to LAD and LCX arteries.

LAD is a large vessel that has no plaque.

LCX is a non-dominant artery that gives rise to one large OM1
branch. There is no plaque.

Other findings:

Normal pulmonary vein drainage into the left atrium.

Normal left atrial appendage without a thrombus.

Normal size of the pulmonary artery.
IMPRESSION: 1. Normal coronary calcium score of 0. Patient is low risk for
coronary events.

2. Normal coronary origin with right dominance.

3. No evidence of CAD.

4. CAD-RADS 0. No evidence of CAD (0%). Consider non-atherosclerotic
causes of chest pain.

5. Image quality degraded by obesity related artifacts.

## 2022-12-21 ENCOUNTER — Other Ambulatory Visit: Payer: Self-pay | Admitting: Acute Care

## 2022-12-21 DIAGNOSIS — Z87891 Personal history of nicotine dependence: Secondary | ICD-10-CM

## 2022-12-21 DIAGNOSIS — Z122 Encounter for screening for malignant neoplasm of respiratory organs: Secondary | ICD-10-CM

## 2022-12-21 DIAGNOSIS — F17210 Nicotine dependence, cigarettes, uncomplicated: Secondary | ICD-10-CM

## 2023-02-15 ENCOUNTER — Ambulatory Visit: Admission: RE | Admit: 2023-02-15 | Payer: 59 | Source: Ambulatory Visit

## 2023-03-07 ENCOUNTER — Ambulatory Visit
Admission: RE | Admit: 2023-03-07 | Discharge: 2023-03-07 | Disposition: A | Discharge status: Home / Self Care | Payer: Medicare Other | Source: Ambulatory Visit | Attending: Acute Care | Admitting: Acute Care

## 2023-03-07 DIAGNOSIS — Z122 Encounter for screening for malignant neoplasm of respiratory organs: Secondary | ICD-10-CM | POA: Diagnosis present

## 2023-03-07 DIAGNOSIS — F17210 Nicotine dependence, cigarettes, uncomplicated: Secondary | ICD-10-CM | POA: Insufficient documentation

## 2023-03-07 DIAGNOSIS — Z87891 Personal history of nicotine dependence: Secondary | ICD-10-CM | POA: Insufficient documentation

## 2023-03-15 ENCOUNTER — Other Ambulatory Visit: Payer: Self-pay

## 2023-03-15 DIAGNOSIS — Z122 Encounter for screening for malignant neoplasm of respiratory organs: Secondary | ICD-10-CM

## 2023-03-15 DIAGNOSIS — Z87891 Personal history of nicotine dependence: Secondary | ICD-10-CM

## 2023-03-15 DIAGNOSIS — F17210 Nicotine dependence, cigarettes, uncomplicated: Secondary | ICD-10-CM

## 2023-09-30 ENCOUNTER — Emergency Department
Admission: EM | Admit: 2023-09-30 | Discharge: 2023-09-30 | Disposition: A | Discharge status: Home / Self Care | Attending: Emergency Medicine | Admitting: Emergency Medicine

## 2023-09-30 ENCOUNTER — Emergency Department

## 2023-09-30 ENCOUNTER — Other Ambulatory Visit: Payer: Self-pay

## 2023-09-30 DIAGNOSIS — R059 Cough, unspecified: Secondary | ICD-10-CM | POA: Diagnosis present

## 2023-09-30 DIAGNOSIS — J209 Acute bronchitis, unspecified: Secondary | ICD-10-CM | POA: Diagnosis not present

## 2023-09-30 LAB — BASIC METABOLIC PANEL
Anion gap: 9 (ref 5–15)
BUN: 7 mg/dL — ABNORMAL LOW (ref 8–23)
CO2: 24 mmol/L (ref 22–32)
Calcium: 8.8 mg/dL — ABNORMAL LOW (ref 8.9–10.3)
Chloride: 108 mmol/L (ref 98–111)
Creatinine, Ser: 0.69 mg/dL (ref 0.44–1.00)
GFR, Estimated: >60 mL/min (ref >60)
Glucose, Bld: 97 mg/dL (ref 70–99)
Potassium: 3.9 mmol/L (ref 3.5–5.1)
Sodium: 141 mmol/L (ref 135–145)

## 2023-09-30 LAB — RESP PANEL BY RT-PCR (RSV, FLU A&B, COVID)  RVPGX2
Influenza A by PCR: NEGATIVE
Influenza B by PCR: NEGATIVE
Resp Syncytial Virus by PCR: NEGATIVE
SARS Coronavirus 2 by RT PCR: NEGATIVE

## 2023-09-30 LAB — CBC
HCT: 46.1 % — ABNORMAL HIGH (ref 36.0–46.0)
Hemoglobin: 15.1 g/dL — ABNORMAL HIGH (ref 12.0–15.0)
MCH: 32 pg (ref 26.0–34.0)
MCHC: 32.8 g/dL (ref 30.0–36.0)
MCV: 97.7 fL (ref 80.0–100.0)
Platelets: 227 10*3/uL (ref 150–400)
RBC: 4.72 MIL/uL (ref 3.87–5.11)
RDW: 12.3 % (ref 11.5–15.5)
WBC: 4.6 10*3/uL (ref 4.0–10.5)
nRBC: 0 % (ref 0.0–0.2)

## 2023-09-30 MED ORDER — PREDNISONE 20 MG PO TABS
60.0000 mg | ORAL_TABLET | Freq: Once | ORAL | Status: AC
  Administered 2023-09-30: 60 mg via ORAL
  Filled 2023-09-30: qty 3

## 2023-09-30 MED ORDER — ALBUTEROL SULFATE (2.5 MG/3ML) 0.083% IN NEBU
2.5000 mg | INHALATION_SOLUTION | Freq: Once | RESPIRATORY_TRACT | Status: AC
  Administered 2023-09-30: 2.5 mg via RESPIRATORY_TRACT

## 2023-09-30 MED ORDER — AZITHROMYCIN 250 MG PO TABS: ORAL_TABLET | ORAL | 0 refills | Status: AC

## 2023-09-30 MED ORDER — IPRATROPIUM-ALBUTEROL 0.5-2.5 (3) MG/3ML IN SOLN
3.0000 mL | Freq: Once | RESPIRATORY_TRACT | Status: AC
  Administered 2023-09-30: 3 mL via RESPIRATORY_TRACT
  Filled 2023-09-30: qty 3

## 2023-09-30 MED ORDER — ALBUTEROL SULFATE HFA 108 (90 BASE) MCG/ACT IN AERS: 2.0000 | INHALATION_SPRAY | RESPIRATORY_TRACT | 0 refills | Status: AC | PRN

## 2023-09-30 MED ORDER — AZITHROMYCIN 500 MG PO TABS: 500.0000 mg | ORAL_TABLET | Freq: Once | ORAL | Status: DC

## 2023-09-30 MED ORDER — PREDNISONE 50 MG PO TABS: 50.0000 mg | ORAL_TABLET | Freq: Every day | ORAL | 0 refills | Status: AC

## 2023-09-30 NOTE — ED Provider Notes (Signed)
 [facility] Provider Note    Event Date/Time   First MD Initiated Contact with Patient 09/30/23 2157     (approximate)   History   Cough   HPI  Tamara Brady is a 63 y.o. female with a history of hyperlipidemia, PVD, and GERD who presents with a nonproductive cough for 2 weeks, persistent course, associated with shortness of breath but not associated with fever or chills, chest pain, or lightheadedness.  She denies any sick contacts.  She reports some pain to her lower ribs anteriorly due to coughing.  She has no vomiting or diarrhea.  She denies any leg swelling.  I reviewed the past medical records.  The patient's most recent outpatient encounter was with orthopedics on 1/8 for knee pain.  She has no recent hospitalizations.   Physical Exam   Triage Vital Signs: ED Triage Vitals  Encounter Vitals Group     BP 09/30/23 2143 (!) 155/102     Systolic BP Percentile --      Diastolic BP Percentile --      Pulse Rate 09/30/23 2143 89     Resp 09/30/23 2143 20     Temp 09/30/23 2143 98 F (36.7 C)     Temp Source 09/30/23 2143 Oral     SpO2 09/30/23 2143 91 %     Weight 09/30/23 2147 300 lb (136.1 kg)     Height 09/30/23 2147 5\' 5"  (1.651 m)     Head Circumference --      Peak Flow --      Pain Score 09/30/23 2144 0     Pain Loc --      Pain Education --      Exclude from Growth Chart --     Most recent vital signs: Vitals:   09/30/23 2143 09/30/23 2300  BP: (!) 155/102 94/77  Pulse: 89 92  Resp: 20 20  Temp: 98 F (36.7 C)   SpO2: 91% 99%     General: Awake, no distress.  CV:  Good peripheral perfusion.  Resp:  Normal effort.  Diffuse wheezing, good air entry. Abd:  No distention.  Other:  No peripheral edema.   ED Results / Procedures / Treatments   Labs (all labs ordered are listed, but only abnormal results are displayed) Labs Reviewed  CBC - Abnormal; Notable for the following components:      Result Value    Hemoglobin 15.1 (*)    HCT 46.1 (*)    All other components within normal limits  BASIC METABOLIC PANEL - Abnormal; Notable for the following components:   BUN 7 (*)    Calcium 8.8 (*)    All other components within normal limits  RESP PANEL BY RT-PCR (RSV, FLU A&B, COVID)  RVPGX2     EKG  ED ECG REPORT I, Dionne Bucy, the attending physician, personally viewed and interpreted this ECG.  Date: 09/30/2023 EKG Time: 2149 Rate: 81 Rhythm: normal sinus rhythm QRS Axis: Left Axis Intervals: normal ST/T Wave abnormalities: normal Narrative Interpretation: no evidence of acute ischemia    RADIOLOGY  Chest x-ray: I independently viewed and interpreted the images; there is no focal consolidation or edema   PROCEDURES:  Critical Care performed: No  Procedures   MEDICATIONS ORDERED IN ED: Medications  albuterol (PROVENTIL) (2.5 MG/3ML) 0.083% nebulizer solution 2.5 mg (2.5 mg Nebulization Given 09/30/23 2151)  predniSONE (DELTASONE) tablet 60 mg (60 mg Oral Given 09/30/23 2246)  ipratropium-albuterol (DUONEB) 0.5-2.5 (3) MG/3ML  nebulizer solution 3 mL (3 mLs Nebulization Given 09/30/23 2247)     IMPRESSION / MDM / ASSESSMENT AND PLAN / ED COURSE  I reviewed the triage vital signs and the nursing notes.  63 year old female with PMH as noted above presents with nonproductive cough and exertional shortness of breath for the last 2 weeks.  On exam she has normal vital signs.  O2 saturation is in the low 90s on room air.  She does not demonstrate any acute respiratory distress or increased work of breathing but there is diffuse wheezing on lung exam bilaterally.  Differential diagnosis includes, but is not limited to, acute bronchitis, influenza, COVID, or other viral syndrome, bacterial pneumonia.  There is no evidence of new onset CHF or other cardiac cause.  The patient does not have a documented history of COPD.  We will give bronchodilators, steroid, obtain basic labs,  chest x-ray, respiratory panel, and reassess.  Patient's presentation is most consistent with acute complicated illness / injury requiring diagnostic workup.  The patient is on the cardiac monitor to evaluate for evidence of arrhythmia and/or significant heart rate changes.   ----------------------------------------- 11:26 PM on 09/30/2023 -----------------------------------------  Chest x-ray is clear.  CBC shows no leukocytosis.  BMP is unremarkable.  Respiratory panel is pending.  O2 saturation remains in the low 90s on room air and the patient appears comfortable with no increased work of breathing, speaking in full sentences.  She will be stable for discharge home.  I counseled her on the results of the workup.  We are just waiting for the respiratory panel.  I have prescribed prednisone, albuterol, and azithromycin.  I gave strict return precautions and she expressed understanding.  The patient will be handed off to the oncoming ED provider pending the respiratory panel.  FINAL CLINICAL IMPRESSION(S) / ED DIAGNOSES   Final diagnoses:  Acute bronchitis, unspecified organism     Rx / DC Orders   ED Discharge Orders          Ordered    predniSONE (DELTASONE) 50 MG tablet  Daily        09/30/23 2326    albuterol (VENTOLIN HFA) 108 (90 Base) MCG/ACT inhaler  Every 4 hours PRN        09/30/23 2326    azithromycin (ZITHROMAX Z-PAK) 250 MG tablet        09/30/23 2326             Note:  This document was prepared using Dragon voice recognition software and may include unintentional dictation errors.    Dionne Bucy, MD 09/30/23 [phone]

## 2023-09-30 NOTE — Discharge Instructions (Addendum)
 Take the prednisone and azithromycin as prescribed starting the day after the ER visit.  Use the albuterol every 4-6 hours for the next several days.  Follow-up with your primary care provider.  In the meantime, return to the ER for new, worsening, or persistent severe shortness of breath, cough, fever, weakness, or any other new or worsening symptoms that concern you.

## 2023-09-30 NOTE — ED Triage Notes (Signed)
 Pt arrived POV for cough x2 weeks and SOB, cough is unproductive, st cannot get phelgm up, no inhaler use, pt is a daily smoker, no know hx of COPD. Expiratory wheezing bilaterally. Pt st her ribs and stomach muscles aches d/t to coughing spells that are uncontrollable. Dyspnea with exertion, otherwise NAD Noted, speaking in complete sentences, sats 91% RA.

## 2024-03-07 ENCOUNTER — Ambulatory Visit
Admission: RE | Admit: 2024-03-07 | Discharge: 2024-03-07 | Disposition: A | Discharge status: Home / Self Care | Source: Ambulatory Visit | Attending: Internal Medicine | Admitting: Internal Medicine

## 2024-03-07 DIAGNOSIS — Z122 Encounter for screening for malignant neoplasm of respiratory organs: Secondary | ICD-10-CM | POA: Insufficient documentation

## 2024-03-07 DIAGNOSIS — F17210 Nicotine dependence, cigarettes, uncomplicated: Secondary | ICD-10-CM | POA: Diagnosis not present

## 2024-03-07 DIAGNOSIS — Z87891 Personal history of nicotine dependence: Secondary | ICD-10-CM

## 2024-03-24 ENCOUNTER — Other Ambulatory Visit: Payer: Self-pay | Admitting: Acute Care

## 2024-03-24 DIAGNOSIS — F17210 Nicotine dependence, cigarettes, uncomplicated: Secondary | ICD-10-CM

## 2024-03-24 DIAGNOSIS — Z122 Encounter for screening for malignant neoplasm of respiratory organs: Secondary | ICD-10-CM

## 2024-03-24 DIAGNOSIS — Z87891 Personal history of nicotine dependence: Secondary | ICD-10-CM
